# Patient Record
Sex: Male | Born: 1940 | Race: White | Hispanic: Yes | Smoking: Never smoker
Health system: Southern US, Community
[De-identification: ages and names within clinical notes are randomized; demographics above are authoritative.]

## PROBLEM LIST (undated history)

## (undated) DIAGNOSIS — I10 Essential (primary) hypertension: Secondary | ICD-10-CM

## (undated) DIAGNOSIS — I255 Ischemic cardiomyopathy: Secondary | ICD-10-CM

## (undated) DIAGNOSIS — Z950 Presence of cardiac pacemaker: Secondary | ICD-10-CM

## (undated) HISTORY — PX: CARDIAC CATHETERIZATION: SHX172

---

## 2020-02-02 ENCOUNTER — Emergency Department (HOSPITAL_COMMUNITY): Payer: HRSA Program

## 2020-02-02 ENCOUNTER — Other Ambulatory Visit: Payer: Self-pay

## 2020-02-02 ENCOUNTER — Inpatient Hospital Stay (HOSPITAL_COMMUNITY)
Admission: EM | Admit: 2020-02-02 | Discharge: 2020-02-26 | DRG: 207 | Disposition: E | Payer: HRSA Program | Attending: Emergency Medicine | Admitting: Emergency Medicine

## 2020-02-02 ENCOUNTER — Encounter (HOSPITAL_COMMUNITY): Payer: Self-pay | Admitting: *Deleted

## 2020-02-02 DIAGNOSIS — N170 Acute kidney failure with tubular necrosis: Secondary | ICD-10-CM | POA: Diagnosis present

## 2020-02-02 DIAGNOSIS — N1832 Chronic kidney disease, stage 3b: Secondary | ICD-10-CM | POA: Diagnosis present

## 2020-02-02 DIAGNOSIS — I129 Hypertensive chronic kidney disease with stage 1 through stage 4 chronic kidney disease, or unspecified chronic kidney disease: Secondary | ICD-10-CM | POA: Diagnosis present

## 2020-02-02 DIAGNOSIS — R569 Unspecified convulsions: Secondary | ICD-10-CM | POA: Diagnosis not present

## 2020-02-02 DIAGNOSIS — G928 Other toxic encephalopathy: Secondary | ICD-10-CM | POA: Diagnosis present

## 2020-02-02 DIAGNOSIS — J1282 Pneumonia due to coronavirus disease 2019: Secondary | ICD-10-CM

## 2020-02-02 DIAGNOSIS — A419 Sepsis, unspecified organism: Secondary | ICD-10-CM | POA: Diagnosis present

## 2020-02-02 DIAGNOSIS — I4891 Unspecified atrial fibrillation: Secondary | ICD-10-CM | POA: Diagnosis present

## 2020-02-02 DIAGNOSIS — I255 Ischemic cardiomyopathy: Secondary | ICD-10-CM | POA: Diagnosis present

## 2020-02-02 DIAGNOSIS — J9600 Acute respiratory failure, unspecified whether with hypoxia or hypercapnia: Secondary | ICD-10-CM

## 2020-02-02 DIAGNOSIS — I6200 Nontraumatic subdural hemorrhage, unspecified: Secondary | ICD-10-CM | POA: Diagnosis present

## 2020-02-02 DIAGNOSIS — E876 Hypokalemia: Secondary | ICD-10-CM | POA: Diagnosis present

## 2020-02-02 DIAGNOSIS — Z66 Do not resuscitate: Secondary | ICD-10-CM | POA: Diagnosis present

## 2020-02-02 DIAGNOSIS — R6521 Severe sepsis with septic shock: Secondary | ICD-10-CM | POA: Diagnosis present

## 2020-02-02 DIAGNOSIS — R739 Hyperglycemia, unspecified: Secondary | ICD-10-CM | POA: Diagnosis not present

## 2020-02-02 DIAGNOSIS — J151 Pneumonia due to Pseudomonas: Secondary | ICD-10-CM | POA: Diagnosis present

## 2020-02-02 DIAGNOSIS — E875 Hyperkalemia: Secondary | ICD-10-CM | POA: Diagnosis not present

## 2020-02-02 DIAGNOSIS — E871 Hypo-osmolality and hyponatremia: Secondary | ICD-10-CM | POA: Diagnosis present

## 2020-02-02 DIAGNOSIS — J9601 Acute respiratory failure with hypoxia: Secondary | ICD-10-CM

## 2020-02-02 DIAGNOSIS — H9190 Unspecified hearing loss, unspecified ear: Secondary | ICD-10-CM | POA: Diagnosis present

## 2020-02-02 DIAGNOSIS — J80 Acute respiratory distress syndrome: Secondary | ICD-10-CM

## 2020-02-02 DIAGNOSIS — I2510 Atherosclerotic heart disease of native coronary artery without angina pectoris: Secondary | ICD-10-CM | POA: Diagnosis present

## 2020-02-02 DIAGNOSIS — U071 COVID-19: Principal | ICD-10-CM | POA: Diagnosis present

## 2020-02-02 DIAGNOSIS — I214 Non-ST elevation (NSTEMI) myocardial infarction: Secondary | ICD-10-CM | POA: Diagnosis present

## 2020-02-02 DIAGNOSIS — Z950 Presence of cardiac pacemaker: Secondary | ICD-10-CM

## 2020-02-02 DIAGNOSIS — Z781 Physical restraint status: Secondary | ICD-10-CM

## 2020-02-02 DIAGNOSIS — A4189 Other specified sepsis: Secondary | ICD-10-CM | POA: Diagnosis present

## 2020-02-02 DIAGNOSIS — I21A1 Myocardial infarction type 2: Secondary | ICD-10-CM | POA: Diagnosis present

## 2020-02-02 DIAGNOSIS — R4182 Altered mental status, unspecified: Secondary | ICD-10-CM | POA: Diagnosis present

## 2020-02-02 DIAGNOSIS — Z7902 Long term (current) use of antithrombotics/antiplatelets: Secondary | ICD-10-CM

## 2020-02-02 DIAGNOSIS — Z515 Encounter for palliative care: Secondary | ICD-10-CM

## 2020-02-02 DIAGNOSIS — T380X5A Adverse effect of glucocorticoids and synthetic analogues, initial encounter: Secondary | ICD-10-CM | POA: Diagnosis not present

## 2020-02-02 DIAGNOSIS — A498 Other bacterial infections of unspecified site: Secondary | ICD-10-CM | POA: Diagnosis present

## 2020-02-02 HISTORY — DX: Ischemic cardiomyopathy: I25.5

## 2020-02-02 HISTORY — DX: Essential (primary) hypertension: I10

## 2020-02-02 HISTORY — DX: Presence of cardiac pacemaker: Z95.0

## 2020-02-02 LAB — I-STAT ARTERIAL BLOOD GAS, ED
Acid-base deficit: 2 mmol/L (ref 0.0–2.0)
Bicarbonate: 19.8 mmol/L — ABNORMAL LOW (ref 20.0–28.0)
Calcium, Ion: 1.04 mmol/L — ABNORMAL LOW (ref 1.15–1.40)
HCT: 38 % — ABNORMAL LOW (ref 39.0–52.0)
Hemoglobin: 12.9 g/dL — ABNORMAL LOW (ref 13.0–17.0)
O2 Saturation: 98 %
Patient temperature: 99.8
Potassium: 2.8 mmol/L — ABNORMAL LOW (ref 3.5–5.1)
Sodium: 127 mmol/L — ABNORMAL LOW (ref 135–145)
TCO2: 21 mmol/L — ABNORMAL LOW (ref 22–32)
pCO2 arterial: 27.7 mmHg — ABNORMAL LOW (ref 32.0–48.0)
pH, Arterial: 7.465 — ABNORMAL HIGH (ref 7.350–7.450)
pO2, Arterial: 104 mmHg (ref 83.0–108.0)

## 2020-02-02 LAB — CBC WITH DIFFERENTIAL/PLATELET
Abs Immature Granulocytes: 0.15 10*3/uL — ABNORMAL HIGH (ref 0.00–0.07)
Basophils Absolute: 0 10*3/uL (ref 0.0–0.1)
Basophils Relative: 0 %
Eosinophils Absolute: 0 10*3/uL (ref 0.0–0.5)
Eosinophils Relative: 0 %
HCT: 43 % (ref 39.0–52.0)
Hemoglobin: 14.1 g/dL (ref 13.0–17.0)
Immature Granulocytes: 1 %
Lymphocytes Relative: 6 %
Lymphs Abs: 1 10*3/uL (ref 0.7–4.0)
MCH: 29.5 pg (ref 26.0–34.0)
MCHC: 32.8 g/dL (ref 30.0–36.0)
MCV: 90 fL (ref 80.0–100.0)
Monocytes Absolute: 0.8 10*3/uL (ref 0.1–1.0)
Monocytes Relative: 4 %
Neutro Abs: 15.6 10*3/uL — ABNORMAL HIGH (ref 1.7–7.7)
Neutrophils Relative %: 89 %
Platelets: 263 10*3/uL (ref 150–400)
RBC: 4.78 MIL/uL (ref 4.22–5.81)
RDW: 14.1 % (ref 11.5–15.5)
WBC: 17.6 10*3/uL — ABNORMAL HIGH (ref 4.0–10.5)
nRBC: 0 % (ref 0.0–0.2)

## 2020-02-02 MED ORDER — ACETAMINOPHEN 650 MG RE SUPP
650.0000 mg | Freq: Once | RECTAL | Status: AC
  Administered 2020-02-03: 650 mg via RECTAL
  Filled 2020-02-02: qty 1

## 2020-02-02 MED ORDER — ACETAMINOPHEN 500 MG PO TABS: 1000.0000 mg | ORAL_TABLET | Freq: Once | ORAL | Status: DC

## 2020-02-02 NOTE — ED Notes (Signed)
medtronic interrogation completed

## 2020-02-02 NOTE — ED Triage Notes (Addendum)
Pt brought into to ER by Son for altered mental status and inability to walk since Saturday. Pt very hard of hearing (hearing aid noted to R ear) and was unable to use translator at triage. Initial sats reading 52% with good pleth in triage, finger tips noted to be dusky. Son at bedside. Today son was giving pt a bath, pt slipped and fell, possibly hitting his head. Pt is on plavix. 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 Son denies covid exposures and pt has been vaccinated

## 2020-02-02 NOTE — ED Provider Notes (Signed)
TIME SEEN: 11:26 PM  CHIEF COMPLAINT: Altered mental status  HPI: Patient is an 79 year old male who presents to the emergency department with his sons for concern for altered mental status since Saturday, December 4.  Patient does not speak [place] and is very hard of hearing and unable to answer many questions.  History obtained from patient's son using Spanish interpreter.  He states that he noticed the patient had difficult time walking and talking since Saturday.  He does report that yesterday he was giving the patient a bath and he slipped in the bathtub and did hit his head.  He is on Plavix.  Has history of CAD, pacemaker, hypertension.  It appears he is on furosemide.  Son is not sure if he has a history of CHF.  He states he has been told before that the patient has problems with his kidneys and liver.  He reports that patient used to drink alcohol heavily when he was younger but does not drink anymore.  No drug use.  Patient's son thought that he noticed yellowing of the patient's eyes.  Patient has been here from [place] for the past 4 months visiting family.  He has been vaccinated for COVID-19 x 2.   ROS: Level 5 caveat for altered mental status and being hard of hearing  PAST MEDICAL HISTORY/PAST SURGICAL HISTORY:  No past medical history on file.  MEDICATIONS:  Prior to Admission medications   Not on File    ALLERGIES:  Not on File  SOCIAL HISTORY:  Social History   Tobacco Use  . Smoking status: Not on file  Substance Use Topics  . Alcohol use: Not on file    FAMILY HISTORY: No family history on file.  EXAM: BP 109/75   Pulse 97   Temp (!) 102.3 F (39.1 C) (Rectal)   Resp (!) 26   SpO2 100%  CONSTITUTIONAL: Alert and oriented to person but does not answer questions appropriately, will follow some commands HEAD: Normocephalic, appears atraumatic EYES: Conjunctivae clear, pupils appear equal, EOM appear intact, no significant scleral icterus ENT: normal nose;  moist mucous membranes NECK: Supple, normal ROM CARD: Irregularly irregular and intermittently tachycardic; S1 and S2 appreciated; no murmurs, no clicks, no rubs, no gallops RESP: Patient is tachypneic.  Bibasilar knuckles on exam.  No rhonchi or wheezing.  Sats 44% on room air.  Dusky fingertips. ABD/GI: Normal bowel sounds; non-distended; soft, non-tender, no rebound, no guarding, no peritoneal signs, no hepatosplenomegaly BACK:  The back appears normal EXT: Normal ROM in all joints; no deformity noted, no edema; no calf tenderness or calf swelling SKIN: Cyanosis noted to patient's fingertips, warm; no rash on exposed skin NEURO: Moves all extremities equally, no pronator drift of bilateral upper extremities, unable to hold legs off of the bed, no obvious facial droop, no obvious dysarthria, unable to follow commands consistently, no asterixis PSYCH: The patient's mood and manner are appropriate.   MEDICAL DECISION MAKING: Patient here with altered mental status, hypoxia.  Differential includes CHF exacerbation, pneumonia, COVID-19, PE, hepatic encephalopathy, sepsis, UTI, intracranial hemorrhage, stroke.  Outside TPA window.  Last seen normal several days ago.  EKG shows atrial fibrillation with rate in the 110's.  It does not appear he has history of the same per records that son has brought to the emergency department.  Labs, urine, head CT, chest x-ray pending.  Patient will need admission.  Sats in the upper 80s, low 90s on nonrebreather.  Will start on BiPAP.  Will  interrogate pacemaker.  ED PROGRESS: Rectal temperature found to be 102.3.  Will begin septic work-up.  Chest x-ray concerning for bilateral pneumonia.  Will give antibiotics for community-acquired pneumonia, IV fluids.    Patient is Covid test is positive.  Blood gas shows PO2 of 104.  Lactate mildly elevated.  He has only had 1 brief episode of hypotension that immediately resolved after rechecking blood pressure.  Will hold on  fluids at this time given he has COVID-19.  We will also cancel antibiotics.  Will discuss with medicine for admission.  Improving on BiPAP.  CT head shows no acute abnormality.  Son at bedside updated.   Troponin elevated.  EKG shows no ischemia.  He denies any chest pain.  Likely demand related.  Will give dose of rectal aspirin.  Creatinine elevated here with no old for comparison.  1:00 AM  Discussed patient's case with hospitalist, Dr. Rachael Darby.  I have recommended admission and patient (and family if present) agree with this plan. Admitting physician will place admission orders.   I reviewed all nursing notes, vitals, pertinent previous records and reviewed/interpreted all EKGs, lab and urine results, imaging (as available).    EKG Interpretation  Date/Time:  Wednesday 16-Feb-2020 23:19:45 EST Ventricular Rate:  122 PR Interval:    QRS Duration: 106 QT Interval:  313 QTC Calculation: 443 R Axis:   50 Text Interpretation: Atrial fibrillation Inferior infarct, age indeterminate Lateral leads are also involved No old tracing to compare Confirmed by Amsi Grimley, Baxter Hire [phone]) on 02/03/2020 12:17:34 AM        CRITICAL CARE Performed by: Baxter Hire Fantashia Shupert   Total critical care time: 65 minutes  Critical care time was exclusive of separately billable procedures and treating other patients.  Critical care was necessary to treat or prevent imminent or life-threatening deterioration.  Critical care was time spent personally by me on the following activities: development of treatment plan with patient and/or surrogate as well as nursing, discussions with consultants, evaluation of patient's response to treatment, examination of patient, obtaining history from patient or surrogate, ordering and performing treatments and interventions, ordering and review of laboratory studies, ordering and review of radiographic studies, pulse oximetry and re-evaluation of patient's condition.   [place]  de [place] Sr. was evaluated in Emergency Department on 02-16-20 for the symptoms described in the history of present illness. He was evaluated in the context of the global COVID-19 pandemic, which necessitated consideration that the patient might be at risk for infection with the SARS-CoV-2 virus that causes COVID-19. Institutional protocols and algorithms that pertain to the evaluation of patients at risk for COVID-19 are in a state of rapid change based on information released by regulatory bodies including the CDC and federal and state organizations. These policies and algorithms were followed during the patient's care in the ED.      Brandelyn Henne, Layla Maw, DO 02/03/20 [phone]

## 2020-02-03 ENCOUNTER — Encounter (HOSPITAL_COMMUNITY): Payer: Self-pay | Admitting: Family Medicine

## 2020-02-03 ENCOUNTER — Inpatient Hospital Stay (HOSPITAL_COMMUNITY): Payer: HRSA Program

## 2020-02-03 ENCOUNTER — Other Ambulatory Visit: Payer: Self-pay

## 2020-02-03 ENCOUNTER — Other Ambulatory Visit (HOSPITAL_COMMUNITY): Payer: Self-pay

## 2020-02-03 DIAGNOSIS — J1282 Pneumonia due to coronavirus disease 2019: Secondary | ICD-10-CM

## 2020-02-03 DIAGNOSIS — I4891 Unspecified atrial fibrillation: Secondary | ICD-10-CM | POA: Diagnosis not present

## 2020-02-03 DIAGNOSIS — J9601 Acute respiratory failure with hypoxia: Secondary | ICD-10-CM

## 2020-02-03 DIAGNOSIS — U071 COVID-19: Secondary | ICD-10-CM | POA: Diagnosis not present

## 2020-02-03 DIAGNOSIS — R778 Other specified abnormalities of plasma proteins: Secondary | ICD-10-CM

## 2020-02-03 DIAGNOSIS — I214 Non-ST elevation (NSTEMI) myocardial infarction: Secondary | ICD-10-CM | POA: Diagnosis not present

## 2020-02-03 DIAGNOSIS — I248 Other forms of acute ischemic heart disease: Secondary | ICD-10-CM | POA: Diagnosis not present

## 2020-02-03 DIAGNOSIS — R4182 Altered mental status, unspecified: Secondary | ICD-10-CM | POA: Diagnosis present

## 2020-02-03 DIAGNOSIS — I361 Nonrheumatic tricuspid (valve) insufficiency: Secondary | ICD-10-CM | POA: Diagnosis not present

## 2020-02-03 DIAGNOSIS — J9600 Acute respiratory failure, unspecified whether with hypoxia or hypercapnia: Secondary | ICD-10-CM | POA: Diagnosis present

## 2020-02-03 DIAGNOSIS — J80 Acute respiratory distress syndrome: Secondary | ICD-10-CM | POA: Diagnosis present

## 2020-02-03 DIAGNOSIS — A419 Sepsis, unspecified organism: Secondary | ICD-10-CM | POA: Diagnosis not present

## 2020-02-03 LAB — FERRITIN: Ferritin: 587 ng/mL — ABNORMAL HIGH (ref 24–336)

## 2020-02-03 LAB — I-STAT ARTERIAL BLOOD GAS, ED
Acid-base deficit: 5 mmol/L — ABNORMAL HIGH (ref 0.0–2.0)
Acid-base deficit: 6 mmol/L — ABNORMAL HIGH (ref 0.0–2.0)
Bicarbonate: 23.3 mmol/L (ref 20.0–28.0)
Bicarbonate: 20.4 mmol/L (ref 20.0–28.0)
Calcium, Ion: 1.14 mmol/L — ABNORMAL LOW (ref 1.15–1.40)
Calcium, Ion: 1.1 mmol/L — ABNORMAL LOW (ref 1.15–1.40)
HCT: 40 % (ref 39.0–52.0)
HCT: 40 % (ref 39.0–52.0)
Hemoglobin: 13.6 g/dL (ref 13.0–17.0)
Hemoglobin: 13.6 g/dL (ref 13.0–17.0)
O2 Saturation: 90 %
O2 Saturation: 95 %
Patient temperature: 98.1
Patient temperature: 97.5
Potassium: 3.4 mmol/L — ABNORMAL LOW (ref 3.5–5.1)
Potassium: 3.9 mmol/L (ref 3.5–5.1)
Sodium: 133 mmol/L — ABNORMAL LOW (ref 135–145)
Sodium: 132 mmol/L — ABNORMAL LOW (ref 135–145)
TCO2: 25 mmol/L (ref 22–32)
TCO2: 22 mmol/L (ref 22–32)
pCO2 arterial: 57.7 mmHg — ABNORMAL HIGH (ref 32.0–48.0)
pCO2 arterial: 41.2 mmHg (ref 32.0–48.0)
pH, Arterial: 7.212 — ABNORMAL LOW (ref 7.350–7.450)
pH, Arterial: 7.298 — ABNORMAL LOW (ref 7.350–7.450)
pO2, Arterial: 70 mmHg — ABNORMAL LOW (ref 83.0–108.0)
pO2, Arterial: 84 mmHg (ref 83.0–108.0)

## 2020-02-03 LAB — URINALYSIS, ROUTINE W REFLEX MICROSCOPIC
Bilirubin Urine: NEGATIVE
Glucose, UA: NEGATIVE mg/dL
Ketones, ur: NEGATIVE mg/dL
Nitrite: NEGATIVE
Protein, ur: 30 mg/dL — AB
Specific Gravity, Urine: 1.018 (ref 1.005–1.030)
pH: 5 (ref 5.0–8.0)

## 2020-02-03 LAB — COMPREHENSIVE METABOLIC PANEL
ALT: 42 U/L (ref 0–44)
ALT: 40 U/L (ref 0–44)
AST: 139 U/L — ABNORMAL HIGH (ref 15–41)
AST: 117 U/L — ABNORMAL HIGH (ref 15–41)
Albumin: 2.9 g/dL — ABNORMAL LOW (ref 3.5–5.0)
Albumin: 2.4 g/dL — ABNORMAL LOW (ref 3.5–5.0)
Alkaline Phosphatase: 51 U/L (ref 38–126)
Alkaline Phosphatase: 47 U/L (ref 38–126)
Anion gap: 16 — ABNORMAL HIGH (ref 5–15)
Anion gap: 16 — ABNORMAL HIGH (ref 5–15)
BUN: 66 mg/dL — ABNORMAL HIGH (ref 8–23)
BUN: 77 mg/dL — ABNORMAL HIGH (ref 8–23)
CO2: 19 mmol/L — ABNORMAL LOW (ref 22–32)
CO2: 20 mmol/L — ABNORMAL LOW (ref 22–32)
Calcium: 8.2 mg/dL — ABNORMAL LOW (ref 8.9–10.3)
Calcium: 7.9 mg/dL — ABNORMAL LOW (ref 8.9–10.3)
Chloride: 93 mmol/L — ABNORMAL LOW (ref 98–111)
Chloride: 97 mmol/L — ABNORMAL LOW (ref 98–111)
Creatinine, Ser: 2.58 mg/dL — ABNORMAL HIGH (ref 0.61–1.24)
Creatinine, Ser: 2.52 mg/dL — ABNORMAL HIGH (ref 0.61–1.24)
GFR, Estimated: 24 mL/min — ABNORMAL LOW (ref >60)
GFR, Estimated: 25 mL/min — ABNORMAL LOW (ref >60)
Glucose, Bld: 129 mg/dL — ABNORMAL HIGH (ref 70–99)
Glucose, Bld: 128 mg/dL — ABNORMAL HIGH (ref 70–99)
Potassium: 3.3 mmol/L — ABNORMAL LOW (ref 3.5–5.1)
Potassium: 3.1 mmol/L — ABNORMAL LOW (ref 3.5–5.1)
Sodium: 128 mmol/L — ABNORMAL LOW (ref 135–145)
Sodium: 133 mmol/L — ABNORMAL LOW (ref 135–145)
Total Bilirubin: 1.7 mg/dL — ABNORMAL HIGH (ref 0.3–1.2)
Total Bilirubin: 1.1 mg/dL (ref 0.3–1.2)
Total Protein: 7.2 g/dL (ref 6.5–8.1)
Total Protein: 6.1 g/dL — ABNORMAL LOW (ref 6.5–8.1)

## 2020-02-03 LAB — ETHANOL: Alcohol, Ethyl (B): <10 mg/dL (ref <10)

## 2020-02-03 LAB — RESP PANEL BY RT-PCR (FLU A&B, COVID) ARPGX2
Influenza A by PCR: NEGATIVE
Influenza B by PCR: NEGATIVE
SARS Coronavirus 2 by RT PCR: POSITIVE — AB

## 2020-02-03 LAB — PROTIME-INR
INR: 1.1 (ref 0.8–1.2)
Prothrombin Time: 14.1 seconds (ref 11.4–15.2)

## 2020-02-03 LAB — CBC
HCT: 39.2 % (ref 39.0–52.0)
Hemoglobin: 12.8 g/dL — ABNORMAL LOW (ref 13.0–17.0)
MCH: 29.2 pg (ref 26.0–34.0)
MCHC: 32.7 g/dL (ref 30.0–36.0)
MCV: 89.3 fL (ref 80.0–100.0)
Platelets: 202 10*3/uL (ref 150–400)
RBC: 4.39 MIL/uL (ref 4.22–5.81)
RDW: 14 % (ref 11.5–15.5)
WBC: 13.1 10*3/uL — ABNORMAL HIGH (ref 4.0–10.5)
nRBC: 0 % (ref 0.0–0.2)

## 2020-02-03 LAB — CBG MONITORING, ED: Glucose-Capillary: 112 mg/dL — ABNORMAL HIGH (ref 70–99)

## 2020-02-03 LAB — FIBRINOGEN: Fibrinogen: >800 mg/dL — ABNORMAL HIGH (ref 210–475)

## 2020-02-03 LAB — TROPONIN I (HIGH SENSITIVITY)
Troponin I (High Sensitivity): 1439 ng/L (ref <18)
Troponin I (High Sensitivity): 4126 ng/L (ref <18)
Troponin I (High Sensitivity): 4188 ng/L (ref <18)
Troponin I (High Sensitivity): 3879 ng/L (ref <18)
Troponin I (High Sensitivity): 1978 ng/L (ref <18)

## 2020-02-03 LAB — LACTATE DEHYDROGENASE: LDH: 728 U/L — ABNORMAL HIGH (ref 98–192)

## 2020-02-03 LAB — RAPID URINE DRUG SCREEN, HOSP PERFORMED
Amphetamines: NOT DETECTED
Barbiturates: NOT DETECTED
Benzodiazepines: NOT DETECTED
Cocaine: NOT DETECTED
Opiates: NOT DETECTED
Tetrahydrocannabinol: NOT DETECTED

## 2020-02-03 LAB — BRAIN NATRIURETIC PEPTIDE: B Natriuretic Peptide: 594.8 pg/mL — ABNORMAL HIGH (ref 0.0–100.0)

## 2020-02-03 LAB — ECHOCARDIOGRAM LIMITED
Height: 62 in
Weight: 3200 oz

## 2020-02-03 LAB — D-DIMER, QUANTITATIVE
D-Dimer, Quant: 2.57 ug/mL-FEU — ABNORMAL HIGH (ref 0.00–0.50)
D-Dimer, Quant: 2.09 ug/mL-FEU — ABNORMAL HIGH (ref 0.00–0.50)

## 2020-02-03 LAB — LACTIC ACID, PLASMA
Lactic Acid, Venous: 2.6 mmol/L (ref 0.5–1.9)
Lactic Acid, Venous: 1.7 mmol/L (ref 0.5–1.9)

## 2020-02-03 LAB — PROCALCITONIN
Procalcitonin: 2.49 ng/mL
Procalcitonin: 2.75 ng/mL
Procalcitonin: 2.35 ng/mL

## 2020-02-03 LAB — APTT: aPTT: 34 seconds (ref 24–36)

## 2020-02-03 LAB — TRIGLYCERIDES: Triglycerides: 110 mg/dL (ref <150)

## 2020-02-03 LAB — GLUCOSE, CAPILLARY
Glucose-Capillary: 162 mg/dL — ABNORMAL HIGH (ref 70–99)
Glucose-Capillary: 156 mg/dL — ABNORMAL HIGH (ref 70–99)

## 2020-02-03 LAB — HEPARIN LEVEL (UNFRACTIONATED): Heparin Unfractionated: 0.31 IU/mL (ref 0.30–0.70)

## 2020-02-03 LAB — C-REACTIVE PROTEIN
CRP: 25.4 mg/dL — ABNORMAL HIGH (ref <1.0)
CRP: 22.3 mg/dL — ABNORMAL HIGH (ref <1.0)

## 2020-02-03 LAB — AMMONIA: Ammonia: 20 umol/L (ref 9–35)

## 2020-02-03 MED ORDER — MIDAZOLAM HCL 2 MG/2ML IJ SOLN: 2.0000 mg | Freq: Once | INTRAMUSCULAR | Status: AC

## 2020-02-03 MED ORDER — FENTANYL CITRATE (PF) 100 MCG/2ML IJ SOLN
100.0000 ug | Freq: Once | INTRAMUSCULAR | Status: AC
  Administered 2020-02-03: 100 ug via INTRAVENOUS

## 2020-02-03 MED ORDER — MIDAZOLAM HCL 2 MG/2ML IJ SOLN
INTRAMUSCULAR | Status: AC
  Administered 2020-02-03: 2 mg via INTRAVENOUS
  Filled 2020-02-03: qty 2

## 2020-02-03 MED ORDER — LACTATED RINGERS IV SOLN: INTRAVENOUS | Status: DC

## 2020-02-03 MED ORDER — FENTANYL BOLUS VIA INFUSION
50.0000 ug | INTRAVENOUS | Status: DC | PRN
  Administered 2020-02-03 – 2020-02-10 (×6): 50 ug via INTRAVENOUS
  Filled 2020-02-03: qty 50

## 2020-02-03 MED ORDER — HEPARIN BOLUS VIA INFUSION
4000.0000 [IU] | Freq: Once | INTRAVENOUS | Status: AC
  Administered 2020-02-03: 4000 [IU] via INTRAVENOUS
  Filled 2020-02-03: qty 4000

## 2020-02-03 MED ORDER — ETOMIDATE 2 MG/ML IV SOLN
20.0000 mg | Freq: Once | INTRAVENOUS | Status: AC
  Administered 2020-02-03: 20 mg via INTRAVENOUS

## 2020-02-03 MED ORDER — LACTATED RINGERS IV BOLUS: 1000.0000 mL | Freq: Once | INTRAVENOUS | Status: DC

## 2020-02-03 MED ORDER — DEXMEDETOMIDINE HCL IN NACL 400 MCG/100ML IV SOLN
0.4000 ug/kg/h | INTRAVENOUS | Status: DC
  Administered 2020-02-03: 0.8 ug/kg/h via INTRAVENOUS
  Administered 2020-02-03: 0.4 ug/kg/h via INTRAVENOUS
  Administered 2020-02-04: 0.6 ug/kg/h via INTRAVENOUS
  Filled 2020-02-03 (×4): qty 100

## 2020-02-03 MED ORDER — ROCURONIUM BROMIDE 50 MG/5ML IV SOLN
80.0000 mg | Freq: Once | INTRAVENOUS | Status: AC
  Administered 2020-02-03: 80 mg via INTRAVENOUS
  Filled 2020-02-03: qty 8

## 2020-02-03 MED ORDER — SODIUM CHLORIDE 0.9 % IV SOLN
200.0000 mg | Freq: Once | INTRAVENOUS | Status: AC
  Administered 2020-02-03: 200 mg via INTRAVENOUS
  Filled 2020-02-03: qty 40

## 2020-02-03 MED ORDER — BARICITINIB 1 MG PO TABS
2.0000 mg | ORAL_TABLET | Freq: Every day | ORAL | Status: DC
  Administered 2020-02-04 – 2020-02-06 (×3): 2 mg
  Filled 2020-02-03: qty 1
  Filled 2020-02-03 (×3): qty 2

## 2020-02-03 MED ORDER — PREDNISONE 20 MG PO TABS: 50.0000 mg | ORAL_TABLET | Freq: Every day | ORAL | Status: DC

## 2020-02-03 MED ORDER — SODIUM CHLORIDE 0.9 % IV SOLN
500.0000 mg | INTRAVENOUS | Status: DC
  Administered 2020-02-03 – 2020-02-07 (×5): 500 mg via INTRAVENOUS
  Filled 2020-02-03 (×5): qty 500

## 2020-02-03 MED ORDER — SODIUM CHLORIDE 0.9 % IV SOLN
500.0000 mg | INTRAVENOUS | Status: DC
  Filled 2020-02-03: qty 500

## 2020-02-03 MED ORDER — CHLORHEXIDINE GLUCONATE CLOTH 2 % EX PADS
6.0000 | MEDICATED_PAD | Freq: Every day | CUTANEOUS | Status: DC
  Administered 2020-02-04 – 2020-02-11 (×9): 6 via TOPICAL

## 2020-02-03 MED ORDER — ATORVASTATIN CALCIUM 10 MG PO TABS: 20.0000 mg | ORAL_TABLET | Freq: Every day | ORAL | Status: DC

## 2020-02-03 MED ORDER — DEXAMETHASONE SODIUM PHOSPHATE 10 MG/ML IJ SOLN
6.0000 mg | Freq: Once | INTRAMUSCULAR | Status: AC
  Administered 2020-02-03: 6 mg via INTRAVENOUS
  Filled 2020-02-03: qty 1

## 2020-02-03 MED ORDER — METHYLPREDNISOLONE SODIUM SUCC 125 MG IJ SOLR
1.0000 mg/kg | Freq: Two times a day (BID) | INTRAMUSCULAR | Status: DC
  Administered 2020-02-03 – 2020-02-04 (×3): 90.625 mg via INTRAVENOUS
  Filled 2020-02-03 (×3): qty 2

## 2020-02-03 MED ORDER — ONDANSETRON HCL 4 MG/2ML IJ SOLN: 4.0000 mg | Freq: Four times a day (QID) | INTRAMUSCULAR | Status: DC | PRN

## 2020-02-03 MED ORDER — SODIUM CHLORIDE 0.9 % IV SOLN
2.0000 g | INTRAVENOUS | Status: DC
  Administered 2020-02-03: 2 g via INTRAVENOUS
  Filled 2020-02-03: qty 20

## 2020-02-03 MED ORDER — SODIUM CHLORIDE 0.9 % IV BOLUS (SEPSIS)
500.0000 mL | Freq: Once | INTRAVENOUS | Status: AC
  Administered 2020-02-03: 500 mL via INTRAVENOUS

## 2020-02-03 MED ORDER — LORAZEPAM 2 MG/ML IJ SOLN
0.5000 mg | Freq: Once | INTRAMUSCULAR | Status: AC
  Administered 2020-02-03: 0.5 mg via INTRAVENOUS
  Filled 2020-02-03: qty 1

## 2020-02-03 MED ORDER — SODIUM CHLORIDE 0.9 % IV SOLN: 100.0000 mg | Freq: Every day | INTRAVENOUS | Status: DC

## 2020-02-03 MED ORDER — FENTANYL CITRATE (PF) 100 MCG/2ML IJ SOLN: 50.0000 ug | Freq: Once | INTRAMUSCULAR | Status: DC

## 2020-02-03 MED ORDER — LACTATED RINGERS IV BOLUS
250.0000 mL | Freq: Once | INTRAVENOUS | Status: AC
  Administered 2020-02-03: 250 mL via INTRAVENOUS

## 2020-02-03 MED ORDER — NOREPINEPHRINE 4 MG/250ML-% IV SOLN
0.0000 ug/min | INTRAVENOUS | Status: DC
  Administered 2020-02-06: 18:00:00 2 ug/min via INTRAVENOUS
  Administered 2020-02-06: 09:00:00 4 ug/min via INTRAVENOUS
  Administered 2020-02-07: 16:00:00 8 ug/min via INTRAVENOUS
  Administered 2020-02-07: 08:00:00 5 ug/min via INTRAVENOUS
  Administered 2020-02-08: 07:00:00 3 ug/min via INTRAVENOUS
  Administered 2020-02-09: 12:00:00 10 ug/min via INTRAVENOUS
  Administered 2020-02-09: 12:00:00 11 ug/min via INTRAVENOUS
  Administered 2020-02-09: 18:00:00 5 ug/min via INTRAVENOUS
  Administered 2020-02-10: 18:00:00 2 ug/min via INTRAVENOUS
  Administered 2020-02-11: 15:00:00 8 ug/min via INTRAVENOUS
  Filled 2020-02-03: qty 500
  Filled 2020-02-03 (×11): qty 250

## 2020-02-03 MED ORDER — GUAIFENESIN-DM 100-10 MG/5ML PO SYRP: 10.0000 mL | ORAL_SOLUTION | ORAL | Status: DC | PRN

## 2020-02-03 MED ORDER — ONDANSETRON HCL 4 MG PO TABS: 4.0000 mg | ORAL_TABLET | Freq: Four times a day (QID) | ORAL | Status: DC | PRN

## 2020-02-03 MED ORDER — SODIUM CHLORIDE 0.9 % IV SOLN
1.0000 g | INTRAVENOUS | Status: DC
  Administered 2020-02-03 – 2020-02-07 (×5): 1 g via INTRAVENOUS
  Filled 2020-02-03 (×5): qty 10

## 2020-02-03 MED ORDER — HYDROCOD POLST-CPM POLST ER 10-8 MG/5ML PO SUER
5.0000 mL | Freq: Two times a day (BID) | ORAL | Status: DC | PRN
  Administered 2020-02-03: 5 mL via ORAL
  Filled 2020-02-03: qty 5

## 2020-02-03 MED ORDER — ASPIRIN EC 81 MG PO TBEC: 81.0000 mg | DELAYED_RELEASE_TABLET | Freq: Every day | ORAL | Status: DC

## 2020-02-03 MED ORDER — POTASSIUM CHLORIDE 10 MEQ/100ML IV SOLN
10.0000 meq | INTRAVENOUS | Status: AC
  Administered 2020-02-03 (×2): 10 meq via INTRAVENOUS
  Filled 2020-02-03 (×2): qty 100

## 2020-02-03 MED ORDER — HEPARIN (PORCINE) 25000 UT/250ML-% IV SOLN
1000.0000 [IU]/h | INTRAVENOUS | Status: DC
  Administered 2020-02-03 – 2020-02-04 (×3): 1000 [IU]/h via INTRAVENOUS
  Filled 2020-02-03 (×3): qty 250

## 2020-02-03 MED ORDER — FENTANYL 2500MCG IN NS 250ML (10MCG/ML) PREMIX INFUSION
50.0000 ug/h | INTRAVENOUS | Status: DC
  Administered 2020-02-03: 50 ug/h via INTRAVENOUS
  Administered 2020-02-04: 125 ug/h via INTRAVENOUS
  Administered 2020-02-05: 18:00:00 150 ug/h via INTRAVENOUS
  Administered 2020-02-05: 01:00:00 100 ug/h via INTRAVENOUS
  Administered 2020-02-06 – 2020-02-07 (×2): 150 ug/h via INTRAVENOUS
  Administered 2020-02-07 – 2020-02-12 (×10): 200 ug/h via INTRAVENOUS
  Filled 2020-02-03 (×16): qty 250

## 2020-02-03 MED ORDER — SODIUM CHLORIDE 0.9 % IV SOLN: 200.0000 mg | Freq: Once | INTRAVENOUS | Status: DC

## 2020-02-03 MED ORDER — ASPIRIN 300 MG RE SUPP
300.0000 mg | Freq: Once | RECTAL | Status: AC
  Administered 2020-02-03: 300 mg via RECTAL
  Filled 2020-02-03: qty 1

## 2020-02-03 MED ORDER — NOREPINEPHRINE 4 MG/250ML-% IV SOLN
INTRAVENOUS | Status: AC
  Administered 2020-02-03: 5 ug/min via INTRAVENOUS
  Filled 2020-02-03: qty 250

## 2020-02-03 MED ORDER — LACTATED RINGERS IV BOLUS (SEPSIS)
1000.0000 mL | Freq: Once | INTRAVENOUS | Status: DC
  Administered 2020-02-03: 1000 mL via INTRAVENOUS

## 2020-02-03 NOTE — Progress Notes (Signed)
Spoke with son who spoke with sister from [place] who is primary caregiver.  Decision made to give trial on ventilator, pressors but in the event of cardiovascular collapse allow to die in peace.  If he languishes on vent, will likely switch GOC to comfort.  Myrla Halsted MD PCCM

## 2020-02-03 NOTE — Sepsis Progress Note (Signed)
following for sepsis monitoring 

## 2020-02-03 NOTE — ED Notes (Signed)
Grandson now at bedside in PPE to see grandfather d/t critical condition and declining cardiac function.

## 2020-02-03 NOTE — Consult Note (Addendum)
02/03/2020 I saw and evaluated the patient. Discussed with resident and agree with resident's findings and plan as documented in the resident's note.  I have seen and evaluated the patient for respiratory failure due to COVID.  S:  79 year old hispanic man with hx of ischemic cardiomyopathy presenting with worsening AMS, dyspnea, falls found to have COVID ARDS, NSTEMI, pulmonary edema.  Respiratory status deteriorating so PCCM consulted for further management.  Patient is visiting his son from [place].  Patient is fully vaccinated.  O: Blood pressure 114/75, pulse 77, temperature (!) 100.7 F (38.2 C), temperature source Rectal, resp. rate (!) 28, height 5\' 2"  (1.575 m), weight 90.7 kg, SpO2 100 %.  Ill appearing man tachypneic on BIPAP Lungs with crackles bases, +accessory muscle use Moves all 4 ext but not to command Heart sounds irregular, ext warm  Reduced renal function, elevated inflammatory markers, trop leak Severe ARDS on CXR CT head looks okay  A:  - Acute hypoxemic respiratory failure secondary to COVID ARDS in fully vaccinated - Underlying ischemic cardiomyopathy with troponin leak, type 1 vs. 2 NSTEMI - Afib with intermittent RVR - Shock distributive vs. Cardiogenic - Elevated Pct question superimposed CAP - Presumed AKI although baseline renal function unknown  P:  -Discussed at length with son (see separate note) -Intubate, ventilate with lung-protective tidal volumes -Prone for P/F <150 -Check SvO2, echo -Steroids, baricitinib -Ceftriaxone/azithromycin -Heparin gtt, ASA, statin -Guarded prognosis, DNR in event of cardiac arrest  The patient is critically ill with multiple organ systems failure and requires high complexity decision making for assessment and support, frequent evaluation and titration of therapies, application of advanced monitoring technologies and extensive interpretation of multiple databases. Critical Care Time devoted to patient care services  described in this note independent of APP/resident  time is 41 minutes.   02/03/2020 14/10/2019 MD      NAME:  Lucas Axel Sr., MRN:  Audria Nine, DOB:  [birth year], LOS: 0 ADMISSION DATE:  01/29/2020, CONSULTATION DATE:  02/03/20 REFERRING MD:  14/9/21  CHIEF COMPLAINT:  Dyspnea, AMS  Brief History   Lucas de La Jerral Ralph Sr. is a 79 y.o. male who resides in [place] but is in [place] visiting family.  He is vaccinated for COVID per reports but was unfortunately admitted overnight 12/8 with COVID PNA.  Initially placed on BiPAP but PCCM consulted AM 12/9 for intubation.  History of present illness   Pt is not able to communicate due to BiPAP, language barrier, hard or hearing state; therefore, this HPI is obtained from chart review. Lucas de La 14/9 Sr. is a 79 y.o. male who has a PMH including but not limited to CAD, HTN, cardiomyopathy, hard of hearing.  He resides in [place] but is in [place] visiting family.  He has been in [place] for 4 weeks.  He has been vaccinated for COVID x 2 per reports.  He presented to [facility] ED 12/8 with AMS, confusion, dyspnea.  He was apparently seen in ED 12/4 with AMS and was discharged home.  12/8, he fell in bathtub and then son noted him to have worsening confusion, weakness, SOB.  In ED, he was found to by hypoxic to the 50's with tachypnea and confusion.  COVID PCR was positive.  He was started on remdesivir and steroids and was placed on BiPAP.  Initial EKG also showed A.fib with RVR where rates improved after BiPAP.  Initial troponin 4126.  Cards consulted for NSTEMI.  CT head negative.  Past Medical History  has Pneumonia due to COVID-19 virus; Acute respiratory failure with hypoxia (HCC); Sepsis (HCC); Altered mental status; Atrial fibrillation with RVR (HCC); and NSTEMI (non-ST elevated myocardial infarction) (HCC) on their problem list.  Significant Hospital Events   12/9 > admitted overnight.  Intubated AM 12/9.  Consults:   PCCM.  Procedures:  ETT 12/9 >   Significant Diagnostic Tests:  CT head 12/ 9 > bilateral ethmoid sinus disease. Echo 12/9 >   Micro Data:  COVID 12/8 > POS. Flu 12/8 > neg. Blood 12/8 >   Antimicrobials:  Azithro 12/8 >  Ceftriaxone 12/8 >  Remdesivir 12/8 >    Interim history/subjective:  Marked distress  Objective:  Blood pressure (!) 84/53, pulse 78, temperature (!) 100.7 F (38.2 C), temperature source Rectal, resp. rate (!) 28, height 5\' 2"  (1.575 m), weight 90.7 kg, SpO2 96 %.    Vent Mode: BIPAP;PCV FiO2 (%):  [100 %] 100 % Set Rate:  [12 bmp] 12 bmp PEEP:  [5 cmH20] 5 cmH20  No intake or output data in the 24 hours ending 02/03/20 0928 Filed Weights   02/03/20 0503  Weight: 90.7 kg    Examination: General: acutely ill 79 year old male  Neuro: will hold hand, won't follow commands. spont moves all 4 ext HEENT: BIPAP in place Cardiovascular: RRR Lungs: decreased t/o RR 30s marked accessory use Abdomen: soft  Musculoskeletal: intact Skin: cool. Mottled   Assessment & Plan:  Acute hypoxic respiratory failure w/ COVID PNA/ARDS +/- pulmonary edema -has failed NIPPV pcxr w/ diffuse airspace disease  Plan Intubate ARDS protocol w/ low VT ventilation Goal plateau pressure less than 30 Goal driving pressures 94 sats >85 pao2 goal > 65 Prone protocol if P/F ratio <150 Day 1 of 5 Remdesivir  Day 1 Solumedrol; taper after 5 days Day 1 rocephin and azith  baricitinib VT day 1 of 14   SIRS/Sepsis in setting of above -s/p 2 liters of crystalloid Plan Transduce CVP Cont IVFs abx as above Trend cbc and lactate  NSTEMI in setting of CAD w/ presumed h/o heart failure; looks like demand ischemia  Has h/o pace maker -trop I climbing Plan IV heparin Tele  Asa VT Cards consult   AF w/ RVR Plan Cont tele  Holding bb  Fluid and electrolyte imbalance: hyponatremia, hypokalemia, anion gap metabolic acidosis Plan Recheck lytes Serial  chemistries Replace as needed   Best Practice:  Diet: NPO Pain/Anxiety/Delirium protocol (if indicated): 12/9 VAP protocol (if indicated): 12/9 DVT prophylaxis: SCD's / Heparin. GI prophylaxis: PPI Glucose control: ssi Mobility: BR Last date of multidisciplinary goals of care discussion: goals of care discussed w/ son. Dr 14/9  Family and staff present: no family present  Summary of discussion: intubate/support but DNR if arrests  Follow up goals of care discussion due:12/16 Code Status: DNR in setting of cardiac arrest Family Communication: son Lucas Clay Disposition: to ICU  Labs   CBC: Recent Labs  Lab 03-Feb-2020 2325 02-03-20 2347 02/03/20 0805  WBC 17.6*  --  13.1*  NEUTROABS 15.6*  --   --   HGB 14.1 12.9* 12.8*  HCT 43.0 38.0* 39.2  MCV 90.0  --  89.3  PLT 263  --  202   Basic Metabolic Panel: Recent Labs  Lab 2020/02/03 2325 02/03/2020 2347  NA 128* 127*  K 3.3* 2.8*  CL 93*  --   CO2 19*  --   GLUCOSE 129*  --   BUN 66*  --  CREATININE 2.58*  --   CALCIUM 8.2*  --    GFR: Estimated Creatinine Clearance: 21.9 mL/min (A) (by C-G formula based on SCr of 2.58 mg/dL (H)). Recent Labs  Lab 02/06/2020 2324 02/24/2020 2325 02/03/20 0402 02/03/20 0507 02/03/20 0805  PROCALCITON  --  2.49  --  2.75  --   WBC  --  17.6*  --   --  13.1*  LATICACIDVEN 2.6*  --  1.7  --   --    Liver Function Tests: Recent Labs  Lab 01/29/2020 2325  AST 139*  ALT 42  ALKPHOS 51  BILITOT 1.7*  PROT 7.2  ALBUMIN 2.9*   No results for input(s): LIPASE, AMYLASE in the last 168 hours. Recent Labs  Lab 02/05/2020 2325  AMMONIA 20   ABG    Component Value Date/Time   PHART 7.465 (H) 02/04/2020 2347   PCO2ART 27.7 (L) 02/13/2020 2347   PO2ART 104 02/14/2020 2347   HCO3 19.8 (L) 02/17/2020 2347   TCO2 21 (L) 01/27/2020 2347   ACIDBASEDEF 2.0 02/24/2020 2347   O2SAT 98.0 02/08/2020 2347    Coagulation Profile: Recent Labs  Lab 01/31/2020 2325  INR 1.1   Cardiac  Enzymes: No results for input(s): CKTOTAL, CKMB, CKMBINDEX, TROPONINI in the last 168 hours. HbA1C: No results found for: HGBA1C CBG: Recent Labs  Lab 02/03/20 0016  GLUCAP 112*    Review of Systems:   Not able   Past medical history  He,  has a past medical history of Hypertension, Ischemic cardiomyopathy, and Pacemaker.   Surgical History    Past Surgical History:  Procedure Laterality Date  . CARDIAC CATHETERIZATION       Social History   reports that he has never smoked. He has never used smokeless tobacco. He reports that he does not drink alcohol and does not use drugs.   Family history   His family history is not on file.   Allergies Not on File   Home meds  Prior to Admission medications   Not on File    Critical care time: 34 min   Simonne Martinet ACNP-BC [facility] Pulmonary/Critical Care Pager # [phone] OR # [phone] if no answer

## 2020-02-03 NOTE — Progress Notes (Signed)
ANTICOAGULATION CONSULT NOTE - Follow Up Consult  Pharmacy Consult for heparin Indication: chest pain/ACS  Not on File  Patient Measurements: Height: 5\' 2"  (157.5 cm) Weight: 90.7 kg (200 lb) IBW/kg (Calculated) : 54.6 Heparin Dosing Weight: 90.7 kg  Vital Signs: Temp: 97.5 F (36.4 C) (12/09 1245) Temp Source: Axillary (12/09 1245) BP: 101/65 (12/09 1330) Pulse Rate: 64 (12/09 1330)  Labs: Recent Labs    02-27-2020 2325 2020/02/27 2335 Feb 27, 2020 2347 02/03/20 0402 02/03/20 0507 02/03/20 0805 02/03/20 1220 02/03/20 1334  HGB 14.1  --  12.9*  --   --  12.8* 13.6  --   HCT 43.0  --  38.0*  --   --  39.2 40.0  --   PLT 263  --   --   --   --  202  --   --   APTT  --  34  --   --   --   --   --   --   LABPROT 14.1  --   --   --   --   --   --   --   INR 1.1  --   --   --   --   --   --   --   HEPARINUNFRC  --   --   --   --   --   --   --  0.31  CREATININE 2.58*  --   --   --   --  2.52*  --   --   TROPONINIHS 1,439*  --   --  4,126* 4,188* 3,879*  --   --     Estimated Creatinine Clearance: 22.4 mL/min (A) (by C-G formula based on SCr of 2.52 mg/dL (H)).   Medical History: Past Medical History:  Diagnosis Date  . Hypertension   . Ischemic cardiomyopathy   . Pacemaker    medtronic    Medications:  unknown Assessment: 79 yo man on IV heparin for ACS. Initial heparin level is therapeutic at 0.31. H/H and Plt wnl today.   Goal of Therapy:  Heparin level 0.3-0.7 units/ml Monitor platelets by anticoagulation protocol: Yes   Plan:  Continue IV heparin at 1000 units/hr  Daily HL and CBC while on heparin Monitor for bleeding complications  94, PharmD., BCPS, BCCCP Clinical Pharmacist Please refer to [facility] for unit-specific pharmacist

## 2020-02-03 NOTE — Progress Notes (Signed)
eLink Physician-Brief Progress Note Patient Name: Lucas Bullinger Sr. DOB: 09-13-1940 MRN: [id number]   Date of Service  02/03/2020  HPI/Events of Note  Patient admitted with severe Covid 19 pneumonia with ARDS, acute hypoxemic respiratory failure,  NSTEMI, AKI , and concern for superinfection with bacterial pneumonia.  eICU Interventions  New Patient Evaluation completed.        Lucas Clay 02/03/2020, 9:16 PM

## 2020-02-03 NOTE — Progress Notes (Signed)
Pt. Transported from Rm 35 to 24 in ED without any complications.

## 2020-02-03 NOTE — ED Notes (Signed)
225 output

## 2020-02-03 NOTE — ED Notes (Signed)
Spoke with [facility] and d/t patient's fragile state and declining condition, son to be called to try and get him to bedside.

## 2020-02-03 NOTE — Consult Note (Signed)
Cardiology Consultation:   Patient ID: Lucas Abbe de La Chapman Moss Sr. MRN: [id number]; DOB: [birth year]  Admit date: 02/22/2020 Date of Consult: 02/03/2020  Primary Care Provider: Patient, No Pcp Per [facility] Cardiologist:  New to [facility]  [facility] Electrophysiologist:  None    Patient Profile:   Lucas de La Chapman Moss Sr. is a 79 y.o. male with a hx of  Ischemic CM, CAD , HTN, pacer  who is being seen today for the evaluation of  Troponin elevation  at the request of  Dr. Katrinka Blazing. .  History of Present Illness:   Lucas Clay is a 79 yo from [place].  He was visiting with his family here in [place].  The patient was seen in our emergency room on December 4 with altered mental status.  At that time he had confusion and difficulty walking but was sent home from the emergency room.  His son reported that he fell in the bathtub yesterday and hit his head.  He has had increased lethargy, confusion, shortness of breath over the previous day.  He reportedly has been vaccinated for COVID-19 with 2 doses of the vaccine.  He has been in [place] for the past 4 months visiting family.  On admission he was found to be hypoxic with O2 saturation in the mid 50s on room air.  He was tachypneic and confused. His chest x-ray is consistent with ARDS.  He was started on steroids and remdesivir.  His troponin level was found to be 4126.  Of note is that his creatinine is 2.5.  He is intubated and unable to give any history .  History was obtained from his son by previous PCCM providers.    Past Medical History:  Diagnosis Date  . Hypertension   . Ischemic cardiomyopathy   . Pacemaker    medtronic    Past Surgical History:  Procedure Laterality Date  . CARDIAC CATHETERIZATION       Home Medications:  Prior to Admission medications   Not on File    Inpatient Medications: Scheduled Meds: . aspirin EC  81 mg Oral Daily  . atorvastatin  20 mg Oral  Daily  . baricitinib  2 mg Per Tube Daily  . fentaNYL (SUBLIMAZE) injection  50 mcg Intravenous Once  . methylPREDNISolone (SOLU-MEDROL) injection  1 mg/kg Intravenous Q12H   Followed by  . [START ON 02/06/2020] predniSONE  50 mg Oral Daily   Continuous Infusions: . azithromycin Stopped (02/03/20 0915)  . cefTRIAXone (ROCEPHIN)  IV Stopped (02/03/20 0804)  . dexmedetomidine (PRECEDEX) IV infusion 0.4 mcg/kg/hr (02/03/20 1254)  . fentaNYL infusion INTRAVENOUS 50 mcg/hr (02/03/20 1018)  . heparin 1,000 Units/hr (02/03/20 1407)  . lactated ringers 75 mL/hr at 02/03/20 1406  . norepinephrine (LEVOPHED) Adult infusion 3 mcg/min (02/03/20 1313)   PRN Meds: chlorpheniramine-HYDROcodone, fentaNYL, guaiFENesin-dextromethorphan, ondansetron **OR** ondansetron (ZOFRAN) IV  Allergies:   Not on File  Social History:   Social History   Socioeconomic History  . Marital status: Unknown    Spouse name: Not on file  . Number of children: Not on file  . Years of education: Not on file  . Highest education level: Not on file  Occupational History  . Not on file  Tobacco Use  . Smoking status: Never Smoker  . Smokeless tobacco: Never Used  Substance and Sexual Activity  . Alcohol use: Never  . Drug use: Never  . Sexual activity: Not on file  Other Topics Concern  .  Not on file  Social History Narrative  . Not on file   Social Determinants of Health   Financial Resource Strain: Not on file  Food Insecurity: Not on file  Transportation Needs: Not on file  Physical Activity: Not on file  Stress: Not on file  Social Connections: Not on file  Intimate Partner Violence: Not on file    Family History:   History reviewed. No pertinent family history.  Unable to get any hx .  Pt is intubated. sedated ROS:  Please see the history of present illness.   All other ROS reviewed and negative.     Physical Exam/Data:   Vitals:   02/03/20 1445 02/03/20 1500 02/03/20 1510 02/03/20 1515   BP: 135/85 (!) 142/82 140/86 (!) 156/79  Pulse: 66 (!) 59 62   Resp: (!) 34 (!) 30 (!) 34   Temp:      TempSrc:      SpO2: 98% 99% 100%   Weight:      Height:        Intake/Output Summary (Last 24 hours) at 02/03/2020 1533 Last data filed at 02/03/2020 1455 Gross per 24 hour  Intake 600 ml  Output 275 ml  Net 325 ml   Last 3 Weights 02/03/2020  Weight (lbs) 200 lb  Weight (kg) 90.719 kg     Body mass index is 36.58 kg/m.  General:  Extremely ill , elderly gentleman,  In the vent , sedated  HEENT: normal Lymph: no adenopathy Neck: no JVD Endocrine:  No thryomegaly Vascular: No carotid bruits; FA pulses 2+ bilaterally without bruits  Cardiac:  Irreg. Irreg.  Lungs:  On the vent  Abd: soft, nontender, no hepatomegaly  Ext: no edema Musculoskeletal:  No deformities, BUE and BLE strength normal and equal Skin: warm and dry  Neuro:  CNs 2-12 intact, no focal abnormalities noted Psych:  Normal affect   EKG:  The EKG was personally reviewed and demonstrates:   Atrial fib with V rate of 122 No ST or T wave changes   Telemetry:  Telemetry was personally reviewed and demonstrates:   Atrial fib   Relevant CV Studies:   Laboratory Data:  High Sensitivity Troponin:   Recent Labs  Lab 02/13/2020 2325 02/03/20 0402 02/03/20 0507 02/03/20 0805 02/03/20 1333  TROPONINIHS 1,439* 4,126* 4,188* 3,879* 1,978*     Chemistry Recent Labs  Lab 02/09/2020 2325 02/24/2020 2347 02/03/20 0805 02/03/20 1220 02/03/20 1454  NA 128*   < > 133* 133* 132*  K 3.3*   < > 3.1* 3.4* 3.9  CL 93*  --  97*  --   --   CO2 19*  --  20*  --   --   GLUCOSE 129*  --  128*  --   --   BUN 66*  --  77*  --   --   CREATININE 2.58*  --  2.52*  --   --   CALCIUM 8.2*  --  7.9*  --   --   GFRNONAA 24*  --  25*  --   --   ANIONGAP 16*  --  16*  --   --    < > = values in this interval not displayed.    Recent Labs  Lab 02/10/2020 2325 02/03/20 0805  PROT 7.2 6.1*  ALBUMIN 2.9* 2.4*  AST 139* 117*   ALT 42 40  ALKPHOS 51 47  BILITOT 1.7* 1.1   Hematology Recent Labs  Lab 02/22/2020 2325 01/31/2020  2347 02/03/20 0805 02/03/20 1220 02/03/20 1454  WBC 17.6*  --  13.1*  --   --   RBC 4.78  --  4.39  --   --   HGB 14.1   < > 12.8* 13.6 13.6  HCT 43.0   < > 39.2 40.0 40.0  MCV 90.0  --  89.3  --   --   MCH 29.5  --  29.2  --   --   MCHC 32.8  --  32.7  --   --   RDW 14.1  --  14.0  --   --   PLT 263  --  202  --   --    < > = values in this interval not displayed.   BNP Recent Labs  Lab 01/28/2020 2325  BNP 594.8*    DDimer  Recent Labs  Lab 02/01/2020 2325 02/03/20 0805  DDIMER 2.57* 2.09*     Radiology/Studies:  CT Head Wo Contrast  Result Date: 02/03/2020 CLINICAL DATA:  Status post fall. EXAM: CT HEAD WITHOUT CONTRAST TECHNIQUE: Contiguous axial images were obtained from the base of the skull through the vertex without intravenous contrast. COMPARISON:  None. FINDINGS: Brain: There is mild to moderate severity cerebral atrophy with widening of the extra-axial spaces and ventricular dilatation. There are areas of decreased attenuation within the white matter tracts of the supratentorial brain, consistent with microvascular disease changes. Vascular: No hyperdense vessel or unexpected calcification. Skull: Normal. Negative for fracture or focal lesion. Sinuses/Orbits: There is mild to moderate severity bilateral ethmoid sinus mucosal thickening. Other: None. IMPRESSION: 1. Generalized atrophy and microvascular disease changes of the supratentorial brain. 2. No acute intracranial abnormality. 3. Mild to moderate severity bilateral ethmoid sinus disease. Electronically Signed   By: Aram Candela M.D.   On: 02/03/2020 00:23   DG Chest Portable 1 View  Result Date: 02/03/2020 CLINICAL DATA:  Post intubation right IJ placement. EXAM: PORTABLE CHEST 1 VIEW COMPARISON:  February 02, 2020. FINDINGS: Interval intubation with ET tube approximately 4.8 cm above the carina. Improved  aeration with redemonstrated bilateral extensive airspace opacities. No visible pleural effusions or pneumothorax. Right IJ central venous catheter with the tip projecting near the superior cavoatrial junction. Gastric tube courses below the diaphragm. Similar mildly enlarged cardiac silhouette. CABG with similar fractured superior two median sternotomy wires. Left approach subclavian approach cardiac rhythm maintenance device. IMPRESSION: 1. Interval intubation with ET tube approximately 4.8 cm above the carina. Improved aeration with redemonstrated bilateral extensive airspace opacities concerning for pneumonia 2. Right IJ central venous catheter with the tip projecting near the superior cavoatrial junction. No visible Newman. Electronically Signed   By: Feliberto Harts MD   On: 02/03/2020 11:41   DG Chest Portable 1 View  Result Date: 01/30/2020 CLINICAL DATA:  Hypoxia EXAM: PORTABLE CHEST 1 VIEW COMPARISON:  None. FINDINGS: Post sternotomy changes. Left-sided pacing device with leads projecting over right atrium and slightly unusual course of additional lead possibly over coronary sinus region. Mild cardiomegaly with aortic atherosclerosis. Extensive bilateral airspace disease with consolidations and ground-glass opacities. No pleural effusion or pneumothorax. IMPRESSION: 1. Extensive bilateral airspace disease with consolidations and ground-glass opacities, favored to represent bilateral pneumonia. 2. Mild cardiomegaly. Electronically Signed   By: Jasmine Pang M.D.   On: 02/23/2020 23:43   ECHOCARDIOGRAM LIMITED  Result Date: 02/03/2020    ECHOCARDIOGRAM LIMITED REPORT   Patient Name:   Lucas de La Chapman Moss Sr. Date of Exam: 02/03/2020 Medical Rec #:  [id number]                           Height:       62.0 in Accession #:    [id number]                          Weight:       200.0 lb Date of Birth:  08/17/1940                            BSA:          1.912 m Patient Age:    79 years                             BP:           108/68 mmHg Patient Gender: M                                   HR:           72 bpm. Exam Location:  Inpatient Procedure: 2D Echo STAT ECHO Indications:    NSTEMI I21.4  History:        Patient has no prior history of Echocardiogram examinations.                 Ischemic Cardiomyopathy, Pacemaker; Risk Factors:Hypertension.  Sonographer:    Thurman Coyerasey Kirkpatrick RDCS (AE) Referring Phys: [id number] Lorin GlassANIEL C SMITH  Sonographer Comments: Echo performed with patient supine and on artificial respirator. IMPRESSIONS  1. Poor acoustic windows Endocardium is difficult to see Overall LVEF is porbably low normal with hypokinesis of the base /mid inferior/inferolateral walls. Would recomm limited echo with Definiity to further define wall motion.. Left ventricular diastolic parameters are indeterminate.  2. Right ventricular systolic function is normal. The right ventricular size is normal. There is moderately elevated pulmonary artery systolic pressure.  3. The aortic valve is grossly normal. Aortic valve regurgitation is not visualized.  4. The inferior vena cava is dilated in size with <50% respiratory variability, suggesting right atrial pressure of 15 mmHg. FINDINGS  Left Ventricle: Poor acoustic windows Endocardium is difficult to see Overall LVEF is porbably low normal with hypokinesis of the base /mid inferior/inferolateral walls. Would recomm limited echo with Definiity to further define wall motion. Left ventricular diastolic parameters are indeterminate. Right Ventricle: The right ventricular size is normal. Right ventricular systolic function is normal. There is moderately elevated pulmonary artery systolic pressure. The tricuspid regurgitant velocity is 2.74 m/s, and with an assumed right atrial pressure of 15 mmHg, the estimated right ventricular systolic pressure is 45.0 mmHg. Left Atrium: Left atrial size was normal in size. Right Atrium: Right atrial size was normal in size.  Pericardium: There is no evidence of pericardial effusion. Tricuspid Valve: The tricuspid valve is not well visualized. Tricuspid valve regurgitation is mild. Aortic Valve: The aortic valve is grossly normal. Aortic valve regurgitation is not visualized. Pulmonic Valve: The pulmonic valve was normal in structure. Pulmonic valve regurgitation is not visualized. Venous: The inferior vena cava is dilated in size with less than 50% respiratory variability, suggesting right atrial pressure of 15 mmHg. LEFT ATRIUM           Index LA Vol (A4C): 23.4 ml 12.24 ml/m  TRICUSPID VALVE TR Peak grad:   30.0 mmHg TR Vmax:        274.00 cm/s Dietrich Pates MD Electronically signed by Dietrich Pates MD Signature Date/Time: 02/03/2020/12:16:33 PM    Final      Assessment and Plan:   1. Elevated troponin:   Likely demand ischemia.   He has been hypoxic in under streaming distress for several days. His EKG shows atrial fibrillation but no acute ST or T wave changes.  Do not think this is an acute plan rupture. Echo shows low-normal LV function.   Not suggestive of a plaque rupture.   His prognosis is quite poor.  He remains hypoxic.  Acidotic  On high dose Levo     2.   Atrial fib:   No previous hx of Afib listed. CHADS2VASC is at least 5    ( ae 79, HTN, CAD, CHF)  On heparin . HR seems to be well controlled.  Cont heparin ,  Cont rate control   3.  Covid pneumonia:   Has ARDS.  Is acidotic.  O2 sats are ok but with 100% FIO2 on the vent . He has minimal urine output     Continue supportive care . No further suggestions.       For questions or updates, please contact [facility] Please consult [email or web address] for contact info under    Signed, Kristeen Miss, MD  02/03/2020 3:33 PM

## 2020-02-03 NOTE — Progress Notes (Signed)
Pt. Transported to CT and back without any complications. °

## 2020-02-03 NOTE — Progress Notes (Signed)
ANTICOAGULATION CONSULT NOTE - Initial Consult  Pharmacy Consult for heparin Indication: chest pain/ACS  Not on File  Patient Measurements: Height: 5\' 2"  (157.5 cm) IBW/kg (Calculated) : 54.6 Heparin Dosing Weight: 90.7 kg  Vital Signs: Temp: 100.7 F (38.2 C) (12/09 0120) Temp Source: Rectal (12/09 0120) BP: 97/59 (12/09 0415) Pulse Rate: 70 (12/09 0415)  Labs: Recent Labs    01/31/2020 2325 02/23/2020 2335 02/01/2020 2347  HGB 14.1  --  12.9*  HCT 43.0  --  38.0*  PLT 263  --   --   APTT  --  34  --   LABPROT 14.1  --   --   INR 1.1  --   --   CREATININE 2.58*  --   --   TROPONINIHS 1,439*  --   --     CrCl cannot be calculated (Unknown ideal weight.).   Medical History: Past Medical History:  Diagnosis Date  . Hypertension   . Ischemic cardiomyopathy   . Pacemaker    medtronic    Medications:  unknown Assessment: 79 yo man to start heparin for ACS.  He was not on anticoagualtion PTA.  Hg 12.9, PTLC 263 Goal of Therapy:  Heparin level 0.3-0.7 units/ml Monitor platelets by anticoagulation protocol: Yes   Plan:  Heparin bolus 4000 units and drip at 1000 units/hr Check heparin level 6-8 hours after start Daily HL and CBC while on heparin Monitor for bleeding complications  Ting Cage Poteet 02/03/2020,4:51 AM

## 2020-02-03 NOTE — Sepsis Progress Note (Signed)
Did not receive iv fluid resuscitation. Per Dr Jolyn Nap note:  "He has only had 1 brief episode of hypotension that immediately resolved after rechecking blood pressure.  Will hold on fluids at this time given he has COVID-19.  We will also cancel antibiotics. " Continue to follow sepsis for remainder of time.

## 2020-02-03 NOTE — Progress Notes (Signed)
eLink Physician-Brief Progress Note Patient Name: Lucas Baswell Sr. DOB: [birth year] MRN: [id number]   Date of Service  02/03/2020  HPI/Events of Note  Patient needs a bilateral wrist restraints order to prevent self-extubation.  eICU Interventions  Bilateral wrist restraints ordered.        Thomasene Lot Cree Kunert 02/03/2020, 9:56 PM

## 2020-02-03 NOTE — Procedures (Signed)
Intubation Procedure Note  Lucas Clay  [id number]  Nov 15, 1940  Date:02/03/20  Time:10:49 AM   Provider Performing:Pete E Tanja Port    Procedure: Intubation (31500)  Indication(s) Respiratory Failure  Consent Risks of the procedure as well as the alternatives and risks of each were explained to the patient and/or caregiver.  Consent for the procedure was obtained and is signed in the bedside chart   Anesthesia Etomidate, Versed, Fentanyl and Rocuronium   Time Out Verified patient identification, verified procedure, site/side was marked, verified correct patient position, special equipment/implants available, medications/allergies/relevant history reviewed, required imaging and test results available.   Sterile Technique Usual hand hygeine, masks, and gloves were used   Procedure Description Patient positioned in bed supine.  Sedation given as noted above.  Patient was intubated with endotracheal tube using Glidescope.  View was Grade 3 only epiglottis .  Number of attempts was 1.  Colorimetric CO2 detector was consistent with tracheal placement.   Complications/Tolerance None; patient tolerated the procedure well. Chest X-ray is ordered to verify placement.   EBL Minimal   Specimen(s) None Simonne Martinet ACNP-BC [facility] Pulmonary/Critical Care Pager # [phone] OR # [phone] if no answer

## 2020-02-03 NOTE — Procedures (Signed)
Central Venous Catheter Insertion Procedure Note  Lucas Keesling Sr.  [id number]  1940/04/24  Date:02/03/20  Time:10:49 AM   Provider Performing:Lucas Clay   Procedure: Insertion of Non-tunneled Central Venous Catheter(36556) with US guidance (29937)   Indication(s) Medication administration and Difficult access  Consent Unable to obtain consent due to emergent nature of procedure.  Anesthesia Topical only with 1% lidocaine   Timeout Verified patient identification, verified procedure, site/side was marked, verified correct patient position, special equipment/implants available, medications/allergies/relevant history reviewed, required imaging and test results available.  Sterile Technique Maximal sterile technique including full sterile barrier drape, hand hygiene, sterile gown, sterile gloves, mask, hair covering, sterile ultrasound probe cover (if used).  Procedure Description Area of catheter insertion was cleaned with chlorhexidine and draped in sterile fashion.  With real-time ultrasound guidance a central venous catheter was placed into the right internal jugular vein. Nonpulsatile blood flow and easy flushing noted in all ports.  The catheter was sutured in place and sterile dressing applied.  Complications/Tolerance None; patient tolerated the procedure well. Chest X-ray is ordered to verify placement for internal jugular or subclavian cannulation.   Chest x-ray is not ordered for femoral cannulation.  EBL Minimal  Specimen(s) None  Lucas Clay ACNP-BC [facility] Pulmonary/Critical Care Pager # [phone] OR # [phone] if no answer

## 2020-02-03 NOTE — Plan of Care (Signed)
  Problem: Education: Goal: Knowledge of risk factors and measures for prevention of condition will improve Outcome: Progressing   Problem: Coping: Goal: Psychosocial and spiritual needs will be supported Outcome: Progressing   Problem: Respiratory: Goal: Will maintain a patent airway Outcome: Progressing Goal: Complications related to the disease process, condition or treatment will be avoided or minimized Outcome: Progressing   

## 2020-02-03 NOTE — H&P (Signed)
History and Physical    [name] LHT:[id number] DOB: 1940-10-20 DOA: February 08, 2020  PCP: Patient, No Pcp Per   Patient coming from:   Home  Chief Complaint: Confusion, difficulty ambulating, shortness of breath  HPI: Lucas de La Chapman Moss Sr. is a 79 y.o. male with history of CAD, cardiomyopathy, hypertension and pacemaker.  Patient does not speak English and is currently on BiPAP and not able to provide any history.  He is also very hard of hearing.  His son was in the emergency room earlier and gave history to the ER physician but is no longer here.  I tried to call the son but did not get an answer.  History was obtained from the ER provider and chart. Patient was seen on Saturday, December 4 in the emergency room with altered mental status.  At that time is reportedly had confusion and difficulty walking but was sent home from the ER.  His son reported that yesterday he fell in the bathtub and hit his head and he has had increased lethargy, confusion and shortness of breath over the last day.  Reportedly he has been vaccinated for COVID-19 with 2 doses of the vaccine.  He has been in [place] for the last 4 months visiting family.  He lives in [place].  ED Course: Patient was found to be hypoxic with O2 sats in the mid 50s on room air when he initially presented.  He was tachypneic and confused. He is COVID postive and CXR consistent with COVID pneumonia. Started on Remdisivir and steroids.   Review of Systems:  Patient unable to provide review of systems secondary to acute medical condition with confusion and on BiPAP  Past Medical History:  Diagnosis Date  . Hypertension   . Ischemic cardiomyopathy   . Pacemaker    medtronic    Past Surgical History:  Procedure Laterality Date  . CARDIAC CATHETERIZATION      Social History  reports that he has never smoked. He has never used smokeless tobacco. He reports that he does not drink alcohol and  does not use drugs.  Not on File  History reviewed. No pertinent family history.   Prior to Admission medications   Not on File    Physical Exam: Vitals:   02/03/20 0307 02/03/20 0315 02/03/20 0345 02/03/20 0415  BP:  90/72 91/68 (!) 97/59  Pulse:  85 85 70  Resp:  19 (!) 28 (!) 29  Temp:      TempSrc:      SpO2:  100% 98% 98%  Height: 5\' 2"  (1.575 m)       Constitutional: NAD, calm, comfortable Vitals:   02/03/20 0307 02/03/20 0315 02/03/20 0345 02/03/20 0415  BP:  90/72 91/68 (!) 97/59  Pulse:  85 85 70  Resp:  19 (!) 28 (!) 29  Temp:      TempSrc:      SpO2:  100% 98% 98%  Height: 5\' 2"  (1.575 m)      General: WDWN.  Opens eyes to tactile stimulation but does not verbally answer Eyes: PERRL, conjunctivae normal.  Sclera nonicteric HENT:  Lucas Clay, external ears normal.  Nares patent without epistasis.  External mouth no lesions. Neck: Soft, normal passive range of motion, supple, no masses, no thyromegaly.  Trachea midline Respiratory: Tachypnea. diminished breath sounds bilaterally with bibasilar crackles., no wheezing, no rhonchi. Normal respiratory effort on BiPAP. No accessory muscle use.  Cardiovascular: Irregularly irregular rhythm with normal rate.  No murmurs / rubs / gallops. No extremity edema. 2+ pedal pulses. No carotid bruits.  Abdomen: Soft, no tenderness, nondistended, no rebound or guarding.  No masses palpated. Bowel sounds normoactive Musculoskeletal: Passive range of motion.  No clubbing / cyanosis. No joint deformity upper and lower extremities.  Skin: Warm, dry, intact no rashes, lesions, ulcers. No induration Neurologic:  Moves all extremities spontaneously.  Babinski downgoing bilaterally.  Grip strength weak but equal    Labs on Admission: I have personally reviewed following labs and imaging studies  CBC: Recent Labs  Lab 02-21-2020 2325 2020-02-21 2347  WBC 17.6*  --   NEUTROABS 15.6*  --   HGB 14.1 12.9*  HCT 43.0 38.0*  MCV 90.0  --    PLT 263  --     Basic Metabolic Panel: Recent Labs  Lab Feb 21, 2020 2325 02-21-20 2347  NA 128* 127*  K 3.3* 2.8*  CL 93*  --   CO2 19*  --   GLUCOSE 129*  --   BUN 66*  --   CREATININE 2.58*  --   CALCIUM 8.2*  --     GFR: CrCl cannot be calculated (Unknown ideal weight.).  Liver Function Tests: Recent Labs  Lab 21-Feb-2020 2325  AST 139*  ALT 42  ALKPHOS 51  BILITOT 1.7*  PROT 7.2  ALBUMIN 2.9*    Urine analysis: No results found for: COLORURINE, APPEARANCEUR, LABSPEC, PHURINE, GLUCOSEU, HGBUR, BILIRUBINUR, KETONESUR, PROTEINUR, UROBILINOGEN, NITRITE, LEUKOCYTESUR  Radiological Exams on Admission: CT Head Wo Contrast  Result Date: 02/03/2020 CLINICAL DATA:  Status post fall. EXAM: CT HEAD WITHOUT CONTRAST TECHNIQUE: Contiguous axial images were obtained from the base of the skull through the vertex without intravenous contrast. COMPARISON:  None. FINDINGS: Brain: There is mild to moderate severity cerebral atrophy with widening of the extra-axial spaces and ventricular dilatation. There are areas of decreased attenuation within the white matter tracts of the supratentorial brain, consistent with microvascular disease changes. Vascular: No hyperdense vessel or unexpected calcification. Skull: Normal. Negative for fracture or focal lesion. Sinuses/Orbits: There is mild to moderate severity bilateral ethmoid sinus mucosal thickening. Other: None. IMPRESSION: 1. Generalized atrophy and microvascular disease changes of the supratentorial brain. 2. No acute intracranial abnormality. 3. Mild to moderate severity bilateral ethmoid sinus disease. Electronically Signed   By: Aram Candela M.D.   On: 02/03/2020 00:23   DG Chest Portable 1 View  Result Date: 02/21/2020 CLINICAL DATA:  Hypoxia EXAM: PORTABLE CHEST 1 VIEW COMPARISON:  None. FINDINGS: Post sternotomy changes. Left-sided pacing device with leads projecting over right atrium and slightly unusual course of additional lead  possibly over coronary sinus region. Mild cardiomegaly with aortic atherosclerosis. Extensive bilateral airspace disease with consolidations and ground-glass opacities. No pleural effusion or pneumothorax. IMPRESSION: 1. Extensive bilateral airspace disease with consolidations and ground-glass opacities, favored to represent bilateral pneumonia. 2. Mild cardiomegaly. Electronically Signed   By: Jasmine Pang M.D.   On: 02/21/20 23:43    EKG: Independently reviewed.  EKG shows atrial fibrillation with RVR.  Nonspecific ST changes but no acute ST elevation or depression.  QTc is normal at 443.  Heart rate now is 80-90 on monitor  Assessment/Plan Principal Problem:   Pneumonia due to COVID-19 virus Patient be admitted to progressive care unit under Covid protocols.  He is placed in a negative pressure room on BiPAP and oxygenation has improved with BiPAP therapy.  RT to follow. Started on remdesivir and Solu-Medrol for Covid pneumonia Monitor inflammatory labs for Covid.  Monitor blood sugars Pt with elevated D-dimer and CTA chest ordered but cannot be obtained due to renal function at this time.  Active Problems:   Acute respiratory failure with hypoxia (HCC) Improved on BiPAP.  Will wean as tolerated.    Sepsis [facility]) Patient meets sepsis criteria with Covid pneumonia with hypoxia and altered mental status.    Atrial fibrillation with RVR (HCC) Initial EKG shows atrial fibrillation with RVR.  Patient is now as atrial fibrillation with controlled rate on telemetry monitor.  We will check serial EKGs over the morning    NSTEMI (non-ST elevated myocardial infarction) (HCC)  Initial troponin level was greater than 1400.  Cardiology's been consulted and cardiology team will see patient in the morning.  We will check serial troponin levels through the night.  Patient is not able to provide any history at this time due to his acute medical condition and being on BiPAP.  No family is at bedside  currently.    Altered mental status Continue to monitor    DVT prophylaxis: Heparin infusion for anticoagulation.  Code Status:   Full Code  Family Communication:  No family at bedside at this time. Called but did not get an answer.  Disposition Plan:   Patient is from:  Home  Anticipated DC to:  home  Anticipated DC date:  Anticipate greater than two midnight stay in hospital to treat acute      condition.  Anticipated DC barriers: Pt does not speak english and is hard of hearing. May need rehab      placement. Will determine after further workup and improvement of      condition.   Consults called:  Cardiology. Discussed with on call cardiologist and cardiology team will see in am.  Admission status:  Inpatient.   Claudean Severance Kelby Adell MD [facility]  How to contact the [facility] Attending or Consulting provider 7A - 7P or covering provider during after hours 7P -7A, for this patient?   1. Check the care team in [facility] and look for a) attending/consulting TR[facility] provider listed and b) the [facility] team listed 2. Log into [email or web address] and use [place]'s universal password to access. If you do not have the password, please contact the hospital operator. 3. Locate the [facility] provider you are looking for under [facility] and page to a number that you can be directly reached. 4. If you still have difficulty reaching the provider, please page the [facility] (Director on Call) for the Hospitalists listed on amion for assistance.  02/03/2020, 4:43 AM

## 2020-02-03 NOTE — Progress Notes (Signed)
  Echocardiogram 2D Echocardiogram has been performed.  [name] 02/03/2020, 11:41 AM

## 2020-02-03 NOTE — Progress Notes (Signed)
Assisted tele visit to patient with family member.  Raylee Adamec D Liesl Simons, RN   

## 2020-02-03 NOTE — Progress Notes (Signed)
RT note. Pt. Transported from ED to 20M rm 9 without any complications.

## 2020-02-03 NOTE — Sepsis Progress Note (Signed)
Sepsis monitoring complete 

## 2020-02-04 ENCOUNTER — Other Ambulatory Visit: Payer: Self-pay

## 2020-02-04 ENCOUNTER — Inpatient Hospital Stay (HOSPITAL_BASED_OUTPATIENT_CLINIC_OR_DEPARTMENT_OTHER): Payer: HRSA Program

## 2020-02-04 DIAGNOSIS — R7989 Other specified abnormal findings of blood chemistry: Secondary | ICD-10-CM | POA: Diagnosis not present

## 2020-02-04 DIAGNOSIS — U071 COVID-19: Secondary | ICD-10-CM | POA: Diagnosis not present

## 2020-02-04 LAB — GLUCOSE, CAPILLARY
Glucose-Capillary: 167 mg/dL — ABNORMAL HIGH (ref 70–99)
Glucose-Capillary: 177 mg/dL — ABNORMAL HIGH (ref 70–99)
Glucose-Capillary: 181 mg/dL — ABNORMAL HIGH (ref 70–99)
Glucose-Capillary: 183 mg/dL — ABNORMAL HIGH (ref 70–99)
Glucose-Capillary: 191 mg/dL — ABNORMAL HIGH (ref 70–99)
Glucose-Capillary: 197 mg/dL — ABNORMAL HIGH (ref 70–99)

## 2020-02-04 LAB — BLOOD CULTURE ID PANEL (REFLEXED) - BCID2

## 2020-02-04 LAB — CBC WITH DIFFERENTIAL/PLATELET
Abs Immature Granulocytes: 0.1 10*3/uL — ABNORMAL HIGH (ref 0.00–0.07)
Basophils Absolute: 0 10*3/uL (ref 0.0–0.1)
Basophils Relative: 0 %
Eosinophils Absolute: 0 10*3/uL (ref 0.0–0.5)
Eosinophils Relative: 0 %
HCT: 40.7 % (ref 39.0–52.0)
Hemoglobin: 13.3 g/dL (ref 13.0–17.0)
Immature Granulocytes: 1 %
Lymphocytes Relative: 3 %
Lymphs Abs: 0.4 10*3/uL — ABNORMAL LOW (ref 0.7–4.0)
MCH: 29.6 pg (ref 26.0–34.0)
MCHC: 32.7 g/dL (ref 30.0–36.0)
MCV: 90.4 fL (ref 80.0–100.0)
Monocytes Absolute: 0.4 10*3/uL (ref 0.1–1.0)
Monocytes Relative: 3 %
Neutro Abs: 12.8 10*3/uL — ABNORMAL HIGH (ref 1.7–7.7)
Neutrophils Relative %: 93 %
Platelets: 237 10*3/uL (ref 150–400)
RBC: 4.5 MIL/uL (ref 4.22–5.81)
RDW: 14 % (ref 11.5–15.5)
WBC: 13.7 10*3/uL — ABNORMAL HIGH (ref 4.0–10.5)
nRBC: 0 % (ref 0.0–0.2)

## 2020-02-04 LAB — POCT I-STAT 7, (LYTES, BLD GAS, ICA,H+H)
Acid-base deficit: 4 mmol/L — ABNORMAL HIGH (ref 0.0–2.0)
Bicarbonate: 20.9 mmol/L (ref 20.0–28.0)
Calcium, Ion: 1.1 mmol/L — ABNORMAL LOW (ref 1.15–1.40)
HCT: 36 % — ABNORMAL LOW (ref 39.0–52.0)
Hemoglobin: 12.2 g/dL — ABNORMAL LOW (ref 13.0–17.0)
O2 Saturation: 91 %
Patient temperature: 98.6
Potassium: 3.9 mmol/L (ref 3.5–5.1)
Sodium: 135 mmol/L (ref 135–145)
TCO2: 22 mmol/L (ref 22–32)
pCO2 arterial: 36.5 mmHg (ref 32.0–48.0)
pH, Arterial: 7.365 (ref 7.350–7.450)
pO2, Arterial: 63 mmHg — ABNORMAL LOW (ref 83.0–108.0)

## 2020-02-04 LAB — COMPREHENSIVE METABOLIC PANEL
ALT: 32 U/L (ref 0–44)
AST: 57 U/L — ABNORMAL HIGH (ref 15–41)
Albumin: 2 g/dL — ABNORMAL LOW (ref 3.5–5.0)
Alkaline Phosphatase: 50 U/L (ref 38–126)
Anion gap: 14 (ref 5–15)
BUN: 78 mg/dL — ABNORMAL HIGH (ref 8–23)
CO2: 20 mmol/L — ABNORMAL LOW (ref 22–32)
Calcium: 7.8 mg/dL — ABNORMAL LOW (ref 8.9–10.3)
Chloride: 100 mmol/L (ref 98–111)
Creatinine, Ser: 2.1 mg/dL — ABNORMAL HIGH (ref 0.61–1.24)
GFR, Estimated: 31 mL/min — ABNORMAL LOW (ref >60)
Glucose, Bld: 190 mg/dL — ABNORMAL HIGH (ref 70–99)
Potassium: 3.8 mmol/L (ref 3.5–5.1)
Sodium: 134 mmol/L — ABNORMAL LOW (ref 135–145)
Total Bilirubin: 0.9 mg/dL (ref 0.3–1.2)
Total Protein: 5.8 g/dL — ABNORMAL LOW (ref 6.5–8.1)

## 2020-02-04 LAB — FERRITIN: Ferritin: 870 ng/mL — ABNORMAL HIGH (ref 24–336)

## 2020-02-04 LAB — MRSA PCR SCREENING: MRSA by PCR: POSITIVE — AB

## 2020-02-04 LAB — MAGNESIUM
Magnesium: 2.1 mg/dL (ref 1.7–2.4)
Magnesium: 2.4 mg/dL (ref 1.7–2.4)

## 2020-02-04 LAB — PHOSPHORUS
Phosphorus: 5.7 mg/dL — ABNORMAL HIGH (ref 2.5–4.6)
Phosphorus: 5.4 mg/dL — ABNORMAL HIGH (ref 2.5–4.6)

## 2020-02-04 LAB — LACTIC ACID, PLASMA
Lactic Acid, Venous: 1.8 mmol/L (ref 0.5–1.9)
Lactic Acid, Venous: 1.5 mmol/L (ref 0.5–1.9)

## 2020-02-04 LAB — HEMOGLOBIN A1C
Hgb A1c MFr Bld: 6.8 % — ABNORMAL HIGH (ref 4.8–5.6)
Mean Plasma Glucose: 148.46 mg/dL

## 2020-02-04 LAB — PROCALCITONIN: Procalcitonin: 1.7 ng/mL

## 2020-02-04 LAB — D-DIMER, QUANTITATIVE: D-Dimer, Quant: 2.36 ug/mL-FEU — ABNORMAL HIGH (ref 0.00–0.50)

## 2020-02-04 LAB — C-REACTIVE PROTEIN: CRP: 22.4 mg/dL — ABNORMAL HIGH (ref <1.0)

## 2020-02-04 LAB — TRIGLYCERIDES: Triglycerides: 70 mg/dL (ref <150)

## 2020-02-04 LAB — APTT: aPTT: 68 seconds — ABNORMAL HIGH (ref 24–36)

## 2020-02-04 MED ORDER — DOCUSATE SODIUM 50 MG/5ML PO LIQD
100.0000 mg | Freq: Two times a day (BID) | ORAL | Status: DC
  Administered 2020-02-04 – 2020-02-06 (×5): 100 mg
  Filled 2020-02-04 (×6): qty 10

## 2020-02-04 MED ORDER — ATORVASTATIN CALCIUM 10 MG PO TABS
20.0000 mg | ORAL_TABLET | Freq: Every day | ORAL | Status: DC
  Administered 2020-02-04 – 2020-02-12 (×9): 20 mg
  Filled 2020-02-04 (×9): qty 2

## 2020-02-04 MED ORDER — FREE WATER
175.0000 mL | Freq: Four times a day (QID) | Status: DC
  Administered 2020-02-04 – 2020-02-10 (×23): 175 mL

## 2020-02-04 MED ORDER — VITAL HIGH PROTEIN PO LIQD
1000.0000 mL | ORAL | Status: DC
  Filled 2020-02-04: qty 1000

## 2020-02-04 MED ORDER — VITAL AF 1.2 CAL PO LIQD
1000.0000 mL | ORAL | Status: DC
  Administered 2020-02-04: 1000 mL
  Filled 2020-02-04 (×6): qty 1000

## 2020-02-04 MED ORDER — PROSOURCE TF PO LIQD
45.0000 mL | Freq: Three times a day (TID) | ORAL | Status: DC
  Administered 2020-02-04 – 2020-02-11 (×22): 45 mL
  Filled 2020-02-04 (×21): qty 45

## 2020-02-04 MED ORDER — ONDANSETRON HCL 4 MG/2ML IJ SOLN: 4.0000 mg | Freq: Four times a day (QID) | INTRAMUSCULAR | Status: DC | PRN

## 2020-02-04 MED ORDER — MIDAZOLAM HCL 2 MG/2ML IJ SOLN
1.0000 mg | INTRAMUSCULAR | Status: DC | PRN
  Administered 2020-02-08: 1 mg via INTRAVENOUS
  Filled 2020-02-04 (×2): qty 2

## 2020-02-04 MED ORDER — HYDROCOD POLST-CPM POLST ER 10-8 MG/5ML PO SUER: 5.0000 mL | Freq: Two times a day (BID) | ORAL | Status: DC | PRN

## 2020-02-04 MED ORDER — SODIUM CHLORIDE 0.9% FLUSH: 10.0000 mL | INTRAVENOUS | Status: DC | PRN

## 2020-02-04 MED ORDER — MIDAZOLAM HCL 2 MG/2ML IJ SOLN
1.0000 mg | INTRAMUSCULAR | Status: DC | PRN
  Administered 2020-02-05: 04:00:00 1 mg via INTRAVENOUS

## 2020-02-04 MED ORDER — INSULIN ASPART 100 UNIT/ML ~~LOC~~ SOLN
0.0000 [IU] | SUBCUTANEOUS | Status: DC
  Administered 2020-02-04 – 2020-02-05 (×6): 3 [IU] via SUBCUTANEOUS
  Administered 2020-02-05 (×2): 5 [IU] via SUBCUTANEOUS
  Administered 2020-02-05: 17:00:00 3 [IU] via SUBCUTANEOUS
  Administered 2020-02-05: 20:00:00 5 [IU] via SUBCUTANEOUS
  Administered 2020-02-06: 3 [IU] via SUBCUTANEOUS
  Administered 2020-02-06 (×2): 5 [IU] via SUBCUTANEOUS
  Administered 2020-02-06 (×2): 3 [IU] via SUBCUTANEOUS
  Administered 2020-02-06: 09:00:00 8 [IU] via SUBCUTANEOUS
  Administered 2020-02-07 (×2): 5 [IU] via SUBCUTANEOUS
  Administered 2020-02-07: 20:00:00 3 [IU] via SUBCUTANEOUS
  Administered 2020-02-07 – 2020-02-08 (×3): 5 [IU] via SUBCUTANEOUS
  Administered 2020-02-08 (×2): 3 [IU] via SUBCUTANEOUS
  Administered 2020-02-08: 13:00:00 5 [IU] via SUBCUTANEOUS
  Administered 2020-02-08 (×2): 8 [IU] via SUBCUTANEOUS
  Administered 2020-02-09: 16:00:00 3 [IU] via SUBCUTANEOUS
  Administered 2020-02-09: 5 [IU] via SUBCUTANEOUS
  Administered 2020-02-09: 20:00:00 3 [IU] via SUBCUTANEOUS
  Administered 2020-02-09 (×2): 2 [IU] via SUBCUTANEOUS
  Administered 2020-02-10 (×3): 3 [IU] via SUBCUTANEOUS
  Administered 2020-02-10: 12:00:00 2 [IU] via SUBCUTANEOUS
  Administered 2020-02-10 – 2020-02-11 (×2): 3 [IU] via SUBCUTANEOUS
  Administered 2020-02-11: 05:00:00 2 [IU] via SUBCUTANEOUS
  Administered 2020-02-11 (×2): 3 [IU] via SUBCUTANEOUS

## 2020-02-04 MED ORDER — MIDAZOLAM HCL 2 MG/2ML IJ SOLN
2.0000 mg | Freq: Once | INTRAMUSCULAR | Status: AC
  Administered 2020-02-04: 2 mg via INTRAVENOUS

## 2020-02-04 MED ORDER — SODIUM CHLORIDE 0.9 % IV SOLN
INTRAVENOUS | Status: DC | PRN
  Administered 2020-02-04: 250 mL via INTRAVENOUS
  Administered 2020-02-09: 12:00:00 1000 mL via INTRAVENOUS

## 2020-02-04 MED ORDER — PROSOURCE TF PO LIQD
45.0000 mL | Freq: Two times a day (BID) | ORAL | Status: DC
  Administered 2020-02-04: 45 mL
  Filled 2020-02-04: qty 45

## 2020-02-04 MED ORDER — POLYETHYLENE GLYCOL 3350 17 G PO PACK
17.0000 g | PACK | Freq: Every day | ORAL | Status: DC
  Administered 2020-02-04 – 2020-02-06 (×3): 17 g
  Filled 2020-02-04 (×4): qty 1

## 2020-02-04 MED ORDER — GUAIFENESIN-DM 100-10 MG/5ML PO SYRP: 10.0000 mL | ORAL_SOLUTION | ORAL | Status: DC | PRN

## 2020-02-04 MED ORDER — ONDANSETRON HCL 4 MG PO TABS: 4.0000 mg | ORAL_TABLET | Freq: Four times a day (QID) | ORAL | Status: DC | PRN

## 2020-02-04 MED ORDER — MUPIROCIN 2 % EX OINT
1.0000 "application " | TOPICAL_OINTMENT | Freq: Two times a day (BID) | CUTANEOUS | Status: AC
  Administered 2020-02-04 – 2020-02-08 (×10): 1 via NASAL
  Filled 2020-02-04 (×4): qty 22

## 2020-02-04 MED ORDER — METHYLPREDNISOLONE SODIUM SUCC 125 MG IJ SOLR
1.0000 mg/kg | Freq: Two times a day (BID) | INTRAMUSCULAR | Status: AC
  Administered 2020-02-04 – 2020-02-05 (×3): 90.625 mg via INTRAVENOUS
  Filled 2020-02-04 (×3): qty 2

## 2020-02-04 MED ORDER — PROPOFOL 1000 MG/100ML IV EMUL
5.0000 ug/kg/min | INTRAVENOUS | Status: DC
  Administered 2020-02-04: 10 ug/kg/min via INTRAVENOUS
  Administered 2020-02-04: 20 ug/kg/min via INTRAVENOUS
  Administered 2020-02-05 (×2): 30 ug/kg/min via INTRAVENOUS
  Administered 2020-02-05: 01:00:00 20 ug/kg/min via INTRAVENOUS
  Administered 2020-02-06 – 2020-02-07 (×6): 30 ug/kg/min via INTRAVENOUS
  Filled 2020-02-04 (×12): qty 100

## 2020-02-04 MED ORDER — CHLORHEXIDINE GLUCONATE 0.12% ORAL RINSE (MEDLINE KIT)
15.0000 mL | Freq: Two times a day (BID) | OROMUCOSAL | Status: DC
  Administered 2020-02-04 – 2020-02-12 (×17): 15 mL via OROMUCOSAL

## 2020-02-04 MED ORDER — MIDAZOLAM HCL 2 MG/2ML IJ SOLN
INTRAMUSCULAR | Status: AC
  Filled 2020-02-04: qty 2

## 2020-02-04 MED ORDER — ORAL CARE MOUTH RINSE
15.0000 mL | OROMUCOSAL | Status: DC
  Administered 2020-02-04 – 2020-02-11 (×75): 15 mL via OROMUCOSAL

## 2020-02-04 MED ORDER — SODIUM CHLORIDE 0.9% FLUSH
10.0000 mL | Freq: Two times a day (BID) | INTRAVENOUS | Status: DC
  Administered 2020-02-04 (×2): 10 mL
  Administered 2020-02-04: 20 mL
  Administered 2020-02-05 – 2020-02-11 (×12): 10 mL

## 2020-02-04 MED ORDER — ASPIRIN 81 MG PO CHEW
81.0000 mg | CHEWABLE_TABLET | Freq: Every day | ORAL | Status: DC
  Administered 2020-02-04 – 2020-02-07 (×3): 81 mg
  Filled 2020-02-04 (×3): qty 1

## 2020-02-04 MED ORDER — PREDNISONE 20 MG PO TABS
50.0000 mg | ORAL_TABLET | Freq: Every day | ORAL | Status: DC
  Administered 2020-02-06 – 2020-02-08 (×3): 50 mg
  Filled 2020-02-04 (×3): qty 1

## 2020-02-04 NOTE — Progress Notes (Signed)
Assisted tele visit to patient with family member.  Lucas Clay Ann, RN  

## 2020-02-04 NOTE — Progress Notes (Signed)
Assisted tele visit to patient with family member.  Shaliah Wann Harold, RN  

## 2020-02-04 NOTE — Progress Notes (Signed)
PHARMACY - PHYSICIAN COMMUNICATION CRITICAL VALUE ALERT - BLOOD CULTURE IDENTIFICATION (BCID)  Lucas de La Chapman Moss Sr. is an 79 y.o. male who presented to [facility] on Feb 08, 2020 with a chief complaint of COVID 19  Assessment:  Pt with 1/4 bottles growing staph epidermidis - likely contaminant  Name of physician (or Provider) Contacted: Dr. Warrick Parisian  Current antibiotics: Azithromycin and Ceftriaxone  Changes to prescribed antibiotics recommended:  No additional abx needed  Results for orders placed or performed during the hospital encounter of 02/08/2020  Blood Culture ID Panel (Reflexed) (Collected: 02/03/2020 12:30 AM)  Result Value Ref Range   Enterococcus faecalis NOT DETECTED NOT DETECTED   Enterococcus Faecium NOT DETECTED NOT DETECTED   Listeria monocytogenes NOT DETECTED NOT DETECTED   Staphylococcus species DETECTED (A) NOT DETECTED   Staphylococcus aureus (BCID) NOT DETECTED NOT DETECTED   Staphylococcus epidermidis DETECTED (A) NOT DETECTED   Staphylococcus lugdunensis NOT DETECTED NOT DETECTED   Streptococcus species NOT DETECTED NOT DETECTED   Streptococcus agalactiae NOT DETECTED NOT DETECTED   Streptococcus pneumoniae NOT DETECTED NOT DETECTED   Streptococcus pyogenes NOT DETECTED NOT DETECTED   A.calcoaceticus-baumannii NOT DETECTED NOT DETECTED   Bacteroides fragilis NOT DETECTED NOT DETECTED   Enterobacterales NOT DETECTED NOT DETECTED   Enterobacter cloacae complex NOT DETECTED NOT DETECTED   Escherichia coli NOT DETECTED NOT DETECTED   Klebsiella aerogenes NOT DETECTED NOT DETECTED   Klebsiella oxytoca NOT DETECTED NOT DETECTED   Klebsiella pneumoniae NOT DETECTED NOT DETECTED   Proteus species NOT DETECTED NOT DETECTED   Salmonella species NOT DETECTED NOT DETECTED   Serratia marcescens NOT DETECTED NOT DETECTED   Haemophilus influenzae NOT DETECTED NOT DETECTED   Neisseria meningitidis NOT DETECTED NOT DETECTED   Pseudomonas aeruginosa NOT DETECTED  NOT DETECTED   Stenotrophomonas maltophilia NOT DETECTED NOT DETECTED   Candida albicans NOT DETECTED NOT DETECTED   Candida auris NOT DETECTED NOT DETECTED   Candida glabrata NOT DETECTED NOT DETECTED   Candida krusei NOT DETECTED NOT DETECTED   Candida parapsilosis NOT DETECTED NOT DETECTED   Candida tropicalis NOT DETECTED NOT DETECTED   Cryptococcus neoformans/gattii NOT DETECTED NOT DETECTED   Methicillin resistance mecA/C NOT DETECTED NOT DETECTED    Christoper Fabian, PharmD, BCPS Please see amion for complete clinical pharmacist phone list 02/04/2020  1:11 AM

## 2020-02-04 NOTE — Progress Notes (Signed)
Bilateral lower extremity venous duplex completed. Refer to "CV Proc" under chart review to view preliminary results.  02/04/2020 2:35 PM Eula Fried., MHA, RVT, RDCS, RDMS

## 2020-02-04 NOTE — Progress Notes (Signed)
Pt is critically ill with COVID, ARDS, respiratory failure Not a candidate for cardiac procedures Continue supportive care per [facility]  Cardiology will sign off. Call for questions.     Kristeen Miss, MD  02/04/2020 10:19 AM    [facility] [address] [place],  [address] [place], [place]  [address] Phone: [phone]; Fax: [phone]

## 2020-02-04 NOTE — Consult Note (Signed)
NAME:  Lucas Whang., MRN:  [id number], DOB:  1940/06/25, LOS: 1 ADMISSION DATE:  02/20/2020, CONSULTATION DATE:  02/03/20 REFERRING MD:  Jerral Ralph  CHIEF COMPLAINT:  Dyspnea, AMS  Brief History   Lucas de La Chapman Moss Sr. is a 79 y.o. male who resides in [place] but is in [place] visiting family.  He is vaccinated for COVID per reports but was unfortunately admitted overnight 12/8 with COVID PNA.  Initially placed on BiPAP but PCCM consulted AM 12/9 for intubation.  History of present illness   Pt is not able to communicate due to BiPAP, language barrier, hard or hearing state; therefore, this HPI is obtained from chart review. Lucas de La Chapman Moss Sr. is a 79 y.o. male who has a PMH including but not limited to CAD, HTN, cardiomyopathy, hard of hearing.  He resides in [place] but is in [place] visiting family.  He has been in [place] for 4 weeks.  He has been vaccinated for COVID x 2 per reports.  He presented to [facility] ED 12/8 with AMS, confusion, dyspnea.  He was apparently seen in ED 12/4 with AMS and was discharged home.  12/8, he fell in bathtub and then son noted him to have worsening confusion, weakness, SOB.  In ED, he was found to by hypoxic to the 50's with tachypnea and confusion.  COVID PCR was positive.  He was started on remdesivir and steroids and was placed on BiPAP.  Initial EKG also showed A.fib with RVR where rates improved after BiPAP.  Initial troponin 4126.  Cards consulted for NSTEMI.  CT head negative.  Past Medical History  has COVID; Acute respiratory failure with hypoxia (HCC); Sepsis (HCC); Altered mental status; Atrial fibrillation with RVR (HCC); NSTEMI (non-ST elevated myocardial infarction) (HCC); and Acute respiratory failure due to COVID-19 [facility]) on their problem list.  Significant Hospital Events   12/9 > admitted overnight.  Intubated AM 12/9.  Consults:  PCCM.  Procedures:  ETT 12/9 >   Significant Diagnostic Tests:  CT head 12/  9 > bilateral ethmoid sinus disease. Echo 12/9 >   Micro Data:  COVID 12/8 > POS. Flu 12/8 > neg. Blood 12/8 >   Antimicrobials:  Azithro 12/8 >  Ceftriaxone 12/8 >  Remdesivir 12/8 >    Interim history/subjective:  Sedated and intubated  Objective:  Blood pressure 112/73, pulse 68, temperature 98.6 F (37 C), temperature source Oral, resp. rate (!) 34, height 5\' 2"  (1.575 m), weight 82.2 kg, SpO2 93 %. CVP:  [4 mmHg-18 mmHg] 4 mmHg  Vent Mode: PRVC FiO2 (%):  [80 %-100 %] 80 % Set Rate:  [34 bmp] 34 bmp Vt Set:  [400 mL] 400 mL PEEP:  [14 cmH20] 14 cmH20 Plateau Pressure:  [26 cmH20-28 cmH20] 26 cmH20   Intake/Output Summary (Last 24 hours) at 02/04/2020 1117 Last data filed at 02/04/2020 1100 Gross per 24 hour  Intake 3413.27 ml  Output 1950 ml  Net 1463.27 ml   Filed Weights   02/03/20 0503 02/03/20 2100  Weight: 90.7 kg 82.2 kg   Physical Exam: General: Critically ill-appearing, sedated HENT: [place], AT, ETT in place Eyes: EOMI, no scleral icterus Respiratory: Clear to auscultation bilaterally.  No crackles, wheezing or rales Cardiovascular: RRR, -M/R/G, no JVD GI: BS+, soft, nontender Extremities:-Edema,-tenderness Neuro: Sedated, withdrawal to pain x 4  Assessment & Plan:  Acute hypoxic respiratory failure w/ COVID PNA/ARDS +/- pulmonary edema Plan ARDS protocol w/ low VT ventilation PAD protocol  for goal RASS -4. Transition from precedex to propofol Day 3 Solumedrol  Day 3 Rocephin and azith  Baricitinib VT day 1 of 14  LE dopplers to rule out DVT  Septic shock secondary to COVID, sedation-related -s/p 2 liters of crystalloid. Requiring low-dose pressor support Plan Continue antibiotics for co-comitant bacterial pneumonia Continue management as above for covid Wean vasopressor support for MAP goal >65 Trend LA  NSTEMI in setting of CAD w/ presumed h/o heart failure; looks like demand ischemia  Has h/o pace maker -Peak troponin 4.1K Plan IV  heparin Tele  ASA  AF w/ RVR Plan Cont telemetry  Continue heparin Holding bb  AKI  - improving Fluid and electrolyte imbalance: hyponatremia, hypokalemia, anion gap metabolic acidosis Plan Monitor UOP/Cr  Best Practice:  Diet: TF Pain/Anxiety/Delirium protocol (if indicated): PAD protocol VAP protocol (if indicated): 12/9 DVT prophylaxis: SCD's / Heparin. GI prophylaxis: PPI Glucose control: ssi Mobility: BR Last date of multidisciplinary goals of care discussion: goals of care discussed w/ son. Dr Katrinka Blazing  Family and staff present: no family present  Summary of discussion: intubate/support but DNR if arrests  Follow up goals of care discussion due:12/16 Code Status: DNR in setting of cardiac arrest Family Communication: son Lucas Clay. Will update family with interpretor today Disposition:  ICU  Labs   CBC: Recent Labs  Lab 02/17/2020 2325 01/29/2020 2347 02/03/20 0805 02/03/20 1220 02/03/20 1454 02/04/20 0451 02/04/20 0826  WBC 17.6*  --  13.1*  --   --  13.7*  --   NEUTROABS 15.6*  --   --   --   --  12.8*  --   HGB 14.1   < > 12.8* 13.6 13.6 13.3 12.2*  HCT 43.0   < > 39.2 40.0 40.0 40.7 36.0*  MCV 90.0  --  89.3  --   --  90.4  --   PLT 263  --  202  --   --  237  --    < > = values in this interval not displayed.   Basic Metabolic Panel: Recent Labs  Lab 01/27/2020 2325 02/14/2020 2347 02/03/20 0805 02/03/20 1220 02/03/20 1454 02/04/20 0451 02/04/20 0826 02/04/20 0836  NA 128*   < > 133* 133* 132* 134* 135  --   K 3.3*   < > 3.1* 3.4* 3.9 3.8 3.9  --   CL 93*  --  97*  --   --  100  --   --   CO2 19*  --  20*  --   --  20*  --   --   GLUCOSE 129*  --  128*  --   --  190*  --   --   BUN 66*  --  77*  --   --  78*  --   --   CREATININE 2.58*  --  2.52*  --   --  2.10*  --   --   CALCIUM 8.2*  --  7.9*  --   --  7.8*  --   --   MG  --   --   --   --   --   --   --  2.1  PHOS  --   --   --   --   --   --   --  5.7*   < > = values in this interval not  displayed.   GFR: Estimated Creatinine Clearance: 25.6 mL/min (A) (by C-G formula based on  SCr of 2.1 mg/dL (H)). Recent Labs  Lab 02/21/2020 2324 01/26/2020 2325 02/03/20 0402 02/03/20 0507 02/03/20 0805 02/04/20 0451  PROCALCITON  --  2.49  --  2.75 2.35 1.70  WBC  --  17.6*  --   --  13.1* 13.7*  LATICACIDVEN 2.6*  --  1.7  --   --   --    Liver Function Tests: Recent Labs  Lab 02/06/2020 2325 02/03/20 0805 02/04/20 0451  AST 139* 117* 57*  ALT 42 40 32  ALKPHOS 51 47 50  BILITOT 1.7* 1.1 0.9  PROT 7.2 6.1* 5.8*  ALBUMIN 2.9* 2.4* 2.0*   No results for input(s): LIPASE, AMYLASE in the last 168 hours. Recent Labs  Lab 02/09/2020 2325  AMMONIA 20   ABG    Component Value Date/Time   PHART 7.365 02/04/2020 0826   PCO2ART 36.5 02/04/2020 0826   PO2ART 63 (L) 02/04/2020 0826   HCO3 20.9 02/04/2020 0826   TCO2 22 02/04/2020 0826   ACIDBASEDEF 4.0 (H) 02/04/2020 0826   O2SAT 91.0 02/04/2020 0826    Coagulation Profile: Recent Labs  Lab 01/31/2020 2325  INR 1.1   Cardiac Enzymes: No results for input(s): CKTOTAL, CKMB, CKMBINDEX, TROPONINI in the last 168 hours. HbA1C: Hgb A1c MFr Bld  Date/Time Value Ref Range Status  02/04/2020 08:35 AM 6.8 (H) 4.8 - 5.6 % Final    Comment:    (NOTE) Pre diabetes:          5.7%-6.4%  Diabetes:              >6.4%  Glycemic control for   <7.0% adults with diabetes    CBG: Recent Labs  Lab 02/03/20 0016 02/03/20 2043 02/03/20 2308 02/04/20 0307 02/04/20 0758  GLUCAP 112* 162* 156* 167* 183*    Review of Systems:   Not able   Past medical history  He,  has a past medical history of Hypertension, Ischemic cardiomyopathy, and Pacemaker.   Surgical History    Past Surgical History:  Procedure Laterality Date  . CARDIAC CATHETERIZATION       Social History   reports that he has never smoked. He has never used smokeless tobacco. He reports that he does not drink alcohol and does not use drugs.   Family history    His family history is not on file.   Allergies No Known Allergies   Home meds  Prior to Admission medications   Not on File    Critical care time: 34 min   Simonne Martinet ACNP-BC [facility] Pulmonary/Critical Care Pager # [phone] OR # [phone] if no answer

## 2020-02-04 NOTE — Progress Notes (Addendum)
Initial Nutrition Assessment  DOCUMENTATION CODES:   Not applicable  INTERVENTION:   Initiate tube feeding via OG tube: Vital AF 1.2 at 50 ml/h (1200 ml per day) Prosource TF 45 ml TID  Provides 1560 kcal (1621 kcal total with propofol), 123 gm protein, 973 ml free water daily  Free water flushes 175 ml every 6 hours for total of 1673 ml free water daily.  NUTRITION DIAGNOSIS:   Increased nutrient needs related to acute illness (COVID ARDS) as evidenced by estimated needs.  GOAL:   Patient will meet greater than or equal to 90% of their needs  MONITOR:   Vent status,TF tolerance,Labs  REASON FOR ASSESSMENT:   Ventilator,Consult Enteral/tube feeding initiation and management  ASSESSMENT:   79 yo male admitted with respiratory failure, COVID ARDS, NSTEMI, pulmonary edema. Patient is visiting his son from [place]. PMH includes ischemic cardiomyopathy, HTN, pacemaker.   Noted guarded prognosis and plans for DNR in event of cardiac arrest. Patient is currently not a candidate for cardiac procedures due to critical illness per Cardiology. Received MD Consult for TF initiation and management. OG tube in place.  Patient is currently intubated on ventilator support MV: 12.6 L/min Temp (24hrs), Avg:97.9 F (36.6 C), Min:97.5 F (36.4 C), Max:98.6 F (37 C)  Propofol: 2.3 ml/hr providing 61 kcal from lipid  Labs reviewed. Phos 5.7, A1C 6.8 CBG: 167-183  Medications reviewed and include baricitinib, Colace, novolog, solumedrol, Miralax, levophed, propofol.  No recent weights available PTA.  I/O +2 L since admission UOP 1437 ml x 24 hours  Diet Order:   Diet Order            Diet NPO time specified  Diet effective now                 EDUCATION NEEDS:   Not appropriate for education at this time  Skin:  Skin Assessment: Reviewed RN Assessment  Last BM:  PTA  Height:   Ht Readings from Last 1 Encounters:  02/03/20 5\' 2"  (1.575 m)    Weight:   Wt  Readings from Last 1 Encounters:  02/03/20 82.2 kg    BMI:  Body mass index is 33.15 kg/m.  Estimated Nutritional Needs:   Kcal:  14/09/21  Protein:  110-125 gm  Fluid:  >/= 2 L    [id number], RD, LDN, CNSC Please refer to Amion for contact information.

## 2020-02-04 NOTE — Progress Notes (Signed)
ANTICOAGULATION CONSULT NOTE - Follow Up Consult  Pharmacy Consult for heparin Indication: chest pain/ACS  No Known Allergies  Patient Measurements: Height: 5\' 2"  (157.5 cm) Weight: 82.2 kg (181 lb 3.5 oz) IBW/kg (Calculated) : 54.6 Heparin Dosing Weight: 90.7 kg  Vital Signs: Temp: 98.6 F (37 C) (12/10 0755) Temp Source: Oral (12/10 0755) BP: 94/67 (12/10 0730) Pulse Rate: 68 (12/10 0755)  Labs: Recent Labs    19-Feb-2020 2325 2020/02/19 2335 02/19/2020 2347 02/03/20 0507 02/03/20 0805 02/03/20 1220 02/03/20 1333 02/03/20 1334 02/03/20 1454 02/04/20 0451  HGB 14.1  --    < >  --  12.8* 13.6  --   --  13.6 13.3  HCT 43.0  --    < >  --  39.2 40.0  --   --  40.0 40.7  PLT 263  --   --   --  202  --   --   --   --  237  APTT  --  34  --   --   --   --   --   --   --  68*  LABPROT 14.1  --   --   --   --   --   --   --   --   --   INR 1.1  --   --   --   --   --   --   --   --   --   HEPARINUNFRC  --   --   --   --   --   --   --  0.31  --   --   CREATININE 2.58*  --   --   --  2.52*  --   --   --   --  2.10*  TROPONINIHS 1,439*  --    < > 4,188* 3,879*  --  1,978*  --   --   --    < > = values in this interval not displayed.    Estimated Creatinine Clearance: 25.6 mL/min (A) (by C-G formula based on SCr of 2.1 mg/dL (H)).  Assessment: 79 yo man on IV heparin for ACS.  aptt within goal this am - unsure why hep lvl not ordered but can follow aptt this am and resume hep lvl in am  Cbc stable  Goal of Therapy:  Heparin level 0.3-0.7 units/ml Monitor platelets by anticoagulation protocol: Yes   Plan:  Continue IV heparin at 1000 units/hr  Daily HL and CBC while on heparin Monitor for bleeding complications  94, PharmD, BCPS, BCCCP Clinical Pharmacist [phone]  Please check AMION for all [facility] Pharmacy numbers  02/04/2020 8:36 AM

## 2020-02-04 NOTE — Plan of Care (Signed)
  Problem: Education: Goal: Knowledge of risk factors and measures for prevention of condition will improve Outcome: Progressing   

## 2020-02-04 NOTE — Progress Notes (Signed)
Notified E-link of Ferritin level 870. Rn will continue to monitor closely.

## 2020-02-05 ENCOUNTER — Inpatient Hospital Stay (HOSPITAL_COMMUNITY): Payer: HRSA Program

## 2020-02-05 ENCOUNTER — Other Ambulatory Visit: Payer: Self-pay

## 2020-02-05 DIAGNOSIS — U071 COVID-19: Secondary | ICD-10-CM | POA: Diagnosis not present

## 2020-02-05 DIAGNOSIS — R4182 Altered mental status, unspecified: Secondary | ICD-10-CM | POA: Diagnosis not present

## 2020-02-05 LAB — COMPREHENSIVE METABOLIC PANEL
ALT: 26 U/L (ref 0–44)
AST: 27 U/L (ref 15–41)
Albumin: 2.1 g/dL — ABNORMAL LOW (ref 3.5–5.0)
Alkaline Phosphatase: 46 U/L (ref 38–126)
Anion gap: 15 (ref 5–15)
BUN: 113 mg/dL — ABNORMAL HIGH (ref 8–23)
CO2: 18 mmol/L — ABNORMAL LOW (ref 22–32)
Calcium: 7.7 mg/dL — ABNORMAL LOW (ref 8.9–10.3)
Chloride: 100 mmol/L (ref 98–111)
Creatinine, Ser: 2.63 mg/dL — ABNORMAL HIGH (ref 0.61–1.24)
GFR, Estimated: 24 mL/min — ABNORMAL LOW (ref >60)
Glucose, Bld: 205 mg/dL — ABNORMAL HIGH (ref 70–99)
Potassium: 3.4 mmol/L — ABNORMAL LOW (ref 3.5–5.1)
Sodium: 133 mmol/L — ABNORMAL LOW (ref 135–145)
Total Bilirubin: 0.6 mg/dL (ref 0.3–1.2)
Total Protein: 5.6 g/dL — ABNORMAL LOW (ref 6.5–8.1)

## 2020-02-05 LAB — FERRITIN: Ferritin: 561 ng/mL — ABNORMAL HIGH (ref 24–336)

## 2020-02-05 LAB — MAGNESIUM
Magnesium: 2.5 mg/dL — ABNORMAL HIGH (ref 1.7–2.4)
Magnesium: 2.6 mg/dL — ABNORMAL HIGH (ref 1.7–2.4)

## 2020-02-05 LAB — CBC WITH DIFFERENTIAL/PLATELET
Abs Immature Granulocytes: 0.21 10*3/uL — ABNORMAL HIGH (ref 0.00–0.07)
Basophils Absolute: 0 10*3/uL (ref 0.0–0.1)
Basophils Relative: 0 %
Eosinophils Absolute: 0 10*3/uL (ref 0.0–0.5)
Eosinophils Relative: 0 %
HCT: 41.8 % (ref 39.0–52.0)
Hemoglobin: 13.2 g/dL (ref 13.0–17.0)
Immature Granulocytes: 1 %
Lymphocytes Relative: 4 %
Lymphs Abs: 0.8 10*3/uL (ref 0.7–4.0)
MCH: 29.4 pg (ref 26.0–34.0)
MCHC: 31.6 g/dL (ref 30.0–36.0)
MCV: 93.1 fL (ref 80.0–100.0)
Monocytes Absolute: 0.7 10*3/uL (ref 0.1–1.0)
Monocytes Relative: 3 %
Neutro Abs: 19.8 10*3/uL — ABNORMAL HIGH (ref 1.7–7.7)
Neutrophils Relative %: 92 %
Platelets: 416 10*3/uL — ABNORMAL HIGH (ref 150–400)
RBC: 4.49 MIL/uL (ref 4.22–5.81)
RDW: 14.6 % (ref 11.5–15.5)
WBC: 21.5 10*3/uL — ABNORMAL HIGH (ref 4.0–10.5)
nRBC: 0.1 % (ref 0.0–0.2)

## 2020-02-05 LAB — POCT I-STAT 7, (LYTES, BLD GAS, ICA,H+H)
Acid-base deficit: 6 mmol/L — ABNORMAL HIGH (ref 0.0–2.0)
Bicarbonate: 21.8 mmol/L (ref 20.0–28.0)
Calcium, Ion: 1.13 mmol/L — ABNORMAL LOW (ref 1.15–1.40)
HCT: 39 % (ref 39.0–52.0)
Hemoglobin: 13.3 g/dL (ref 13.0–17.0)
O2 Saturation: 88 %
Potassium: 2.9 mmol/L — ABNORMAL LOW (ref 3.5–5.1)
Sodium: 135 mmol/L (ref 135–145)
TCO2: 23 mmol/L (ref 22–32)
pCO2 arterial: 51.4 mmHg — ABNORMAL HIGH (ref 32.0–48.0)
pH, Arterial: 7.236 — ABNORMAL LOW (ref 7.350–7.450)
pO2, Arterial: 64 mmHg — ABNORMAL LOW (ref 83.0–108.0)

## 2020-02-05 LAB — GLUCOSE, CAPILLARY
Glucose-Capillary: 176 mg/dL — ABNORMAL HIGH (ref 70–99)
Glucose-Capillary: 232 mg/dL — ABNORMAL HIGH (ref 70–99)
Glucose-Capillary: 205 mg/dL — ABNORMAL HIGH (ref 70–99)
Glucose-Capillary: 196 mg/dL — ABNORMAL HIGH (ref 70–99)
Glucose-Capillary: 210 mg/dL — ABNORMAL HIGH (ref 70–99)
Glucose-Capillary: 176 mg/dL — ABNORMAL HIGH (ref 70–99)

## 2020-02-05 LAB — BASIC METABOLIC PANEL
Anion gap: 13 (ref 5–15)
BUN: 116 mg/dL — ABNORMAL HIGH (ref 8–23)
CO2: 21 mmol/L — ABNORMAL LOW (ref 22–32)
Calcium: 7.9 mg/dL — ABNORMAL LOW (ref 8.9–10.3)
Chloride: 99 mmol/L (ref 98–111)
Creatinine, Ser: 2.34 mg/dL — ABNORMAL HIGH (ref 0.61–1.24)
GFR, Estimated: 27 mL/min — ABNORMAL LOW (ref >60)
Glucose, Bld: 222 mg/dL — ABNORMAL HIGH (ref 70–99)
Potassium: 2.7 mmol/L — CL (ref 3.5–5.1)
Sodium: 133 mmol/L — ABNORMAL LOW (ref 135–145)

## 2020-02-05 LAB — C-REACTIVE PROTEIN: CRP: 15.4 mg/dL — ABNORMAL HIGH (ref <1.0)

## 2020-02-05 LAB — CULTURE, BLOOD (ROUTINE X 2): Special Requests: ADEQUATE

## 2020-02-05 LAB — HEPARIN LEVEL (UNFRACTIONATED): Heparin Unfractionated: 0.42 IU/mL (ref 0.30–0.70)

## 2020-02-05 LAB — PHOSPHORUS
Phosphorus: 5.8 mg/dL — ABNORMAL HIGH (ref 2.5–4.6)
Phosphorus: 5.7 mg/dL — ABNORMAL HIGH (ref 2.5–4.6)

## 2020-02-05 LAB — D-DIMER, QUANTITATIVE: D-Dimer, Quant: 1.81 ug/mL-FEU — ABNORMAL HIGH (ref 0.00–0.50)

## 2020-02-05 LAB — PROCALCITONIN: Procalcitonin: 1.65 ng/mL

## 2020-02-05 MED ORDER — MIDAZOLAM HCL 2 MG/2ML IJ SOLN: 4.0000 mg | Freq: Once | INTRAMUSCULAR | Status: AC

## 2020-02-05 MED ORDER — POTASSIUM CHLORIDE 10 MEQ/50ML IV SOLN
10.0000 meq | INTRAVENOUS | Status: AC
  Administered 2020-02-05 (×4): 10 meq via INTRAVENOUS
  Filled 2020-02-05 (×4): qty 50

## 2020-02-05 MED ORDER — POTASSIUM CHLORIDE CRYS ER 20 MEQ PO TBCR
40.0000 meq | EXTENDED_RELEASE_TABLET | Freq: Once | ORAL | Status: AC
  Administered 2020-02-05: 18:00:00 40 meq via ORAL
  Filled 2020-02-05: qty 2

## 2020-02-05 MED ORDER — LACTATED RINGERS IV BOLUS
500.0000 mL | Freq: Once | INTRAVENOUS | Status: AC
  Administered 2020-02-05: 20:00:00 500 mL via INTRAVENOUS

## 2020-02-05 MED ORDER — POTASSIUM CHLORIDE 20 MEQ PO PACK
40.0000 meq | PACK | Freq: Once | ORAL | Status: AC
  Administered 2020-02-05: 40 meq
  Filled 2020-02-05: qty 2

## 2020-02-05 MED ORDER — MIDAZOLAM HCL 2 MG/2ML IJ SOLN
INTRAMUSCULAR | Status: AC
  Administered 2020-02-05: 10:00:00 4 mg via INTRAVENOUS
  Filled 2020-02-05: qty 4

## 2020-02-05 MED ORDER — EPINEPHRINE PF 1 MG/ML IJ SOLN
INTRAMUSCULAR | Status: AC
  Filled 2020-02-05: qty 1

## 2020-02-05 MED ORDER — EPINEPHRINE 1 MG/10ML IJ SOSY
PREFILLED_SYRINGE | INTRAMUSCULAR | Status: AC
  Filled 2020-02-05: qty 10

## 2020-02-05 MED ORDER — LEVETIRACETAM 500 MG PO TABS
500.0000 mg | ORAL_TABLET | Freq: Two times a day (BID) | ORAL | Status: DC
  Administered 2020-02-06 (×2): 500 mg via ORAL
  Filled 2020-02-05 (×2): qty 1

## 2020-02-05 MED ORDER — LEVETIRACETAM IN NACL 1000 MG/100ML IV SOLN
1000.0000 mg | Freq: Once | INTRAVENOUS | Status: AC
  Administered 2020-02-05: 20:00:00 1000 mg via INTRAVENOUS
  Filled 2020-02-05: qty 100

## 2020-02-05 NOTE — Procedures (Signed)
Arterial Catheter Insertion Procedure Note  Lucas Clay  [id number]  1941-02-22  Date:02/05/20  Time:11:37 AM    Provider Performing: Brantley Naser Mechele Collin    Procedure: Insertion of Arterial Line (73532) with US guidance (99242)   Indication(s) Blood pressure monitoring and/or need for frequent ABGs  Consent Unable to obtain consent due to emergent nature of procedure.  Anesthesia None   Time Out Verified patient identification, verified procedure, site/side was marked, verified correct patient position, special equipment/implants available, medications/allergies/relevant history reviewed, required imaging and test results available.   Sterile Technique Maximal sterile technique including full sterile barrier drape, hand hygiene, sterile gown, sterile gloves, mask, hair covering, sterile ultrasound probe cover (if used).  Procedure Description Area of catheter insertion was cleaned with chlorhexidine and draped in sterile fashion. With real-time ultrasound guidance an arterial catheter was placed into the left radial artery.  Appropriate arterial tracings confirmed on monitor.     Complications/Tolerance None; patient tolerated the procedure well.   EBL Minimal   Specimen(s) None

## 2020-02-05 NOTE — Progress Notes (Signed)
Assisted tele visit to patient with family member.  Merinda Victorino Harold, RN  

## 2020-02-05 NOTE — Progress Notes (Signed)
ANTICOAGULATION CONSULT NOTE - Follow Up Consult  Pharmacy Consult for heparin Indication: chest pain/ACS  No Known Allergies  Patient Measurements: Height: 5\' 2"  (157.5 cm) Weight: 86 kg (189 lb 9.5 oz) IBW/kg (Calculated) : 54.6 Heparin Dosing Weight: 90.7 kg  Vital Signs: Temp: 98.4 F (36.9 C) (12/11 0800) Temp Source: Axillary (12/11 0800) BP: 106/58 (12/11 0800) Pulse Rate: 72 (12/11 0800)  Labs: Recent Labs    February 27, 2020 2325 02-27-2020 2335 02/27/20 2347 02/03/20 0507 02/03/20 0805 02/03/20 1220 02/03/20 1333 02/03/20 1334 02/03/20 1454 02/04/20 0451 02/04/20 0826 02/05/20 0426 02/05/20 0427  HGB 14.1  --    < >  --  12.8*   < >  --   --    < > 13.3 12.2* 13.2  --   HCT 43.0  --    < >  --  39.2   < >  --   --    < > 40.7 36.0* 41.8  --   PLT 263  --   --   --  202  --   --   --   --  237  --  416*  --   APTT  --  34  --   --   --   --   --   --   --  68*  --   --   --   LABPROT 14.1  --   --   --   --   --   --   --   --   --   --   --   --   INR 1.1  --   --   --   --   --   --   --   --   --   --   --   --   HEPARINUNFRC  --   --   --   --   --   --   --  0.31  --   --   --   --  0.42  CREATININE 2.58*  --   --   --  2.52*  --   --   --   --  2.10*  --  2.63*  --   TROPONINIHS 1,439*  --    < > 4,188* 3,879*  --  1,978*  --   --   --   --   --   --    < > = values in this interval not displayed.    Estimated Creatinine Clearance: 20.9 mL/min (A) (by C-G formula based on SCr of 2.63 mg/dL (H)).  Assessment: 79 yo man on IV heparin for ACS.  Heparin level within goal this am   Cbc stable  Goal of Therapy:  Heparin level 0.3-0.7 units/ml Monitor platelets by anticoagulation protocol: Yes   Plan:  Continue IV heparin at 1000 units/hr  Daily HL and CBC while on heparin Monitor for bleeding complications  94, PharmD, [facility] Clinical Pharmacist Please see AMION for all Pharmacists' Contact Phone Numbers 02/05/2020, 8:46 AM

## 2020-02-05 NOTE — Consult Note (Addendum)
NAME:  Lucas Clay., MRN:  [id number], DOB:  [birth year], LOS: 2 ADMISSION DATE:  01/30/2020, CONSULTATION DATE:  02/03/20 REFERRING MD:  Jerral Ralph  CHIEF COMPLAINT:  Dyspnea, AMS  Brief History   Lucas de La Chapman Moss Sr. is a 79 y.o. male who resides in [place] but is in [place] visiting family.  He is vaccinated for COVID per reports but was unfortunately admitted overnight 12/8 with COVID PNA.  Initially placed on BiPAP but PCCM consulted AM 12/9 for intubation.  History of present illness   Pt is not able to communicate due to BiPAP, language barrier, hard or hearing state; therefore, this HPI is obtained from chart review. Lucas de La Chapman Moss Sr. is a 79 y.o. male who has a PMH including but not limited to CAD, HTN, cardiomyopathy, hard of hearing.  He resides in [place] but is in [place] visiting family.  He has been in [place] for 4 weeks.  He has been vaccinated for COVID x 2 per reports.  He presented to [facility] ED 12/8 with AMS, confusion, dyspnea.  He was apparently seen in ED 12/4 with AMS and was discharged home.  12/8, he fell in bathtub and then son noted him to have worsening confusion, weakness, SOB.  In ED, he was found to by hypoxic to the 50's with tachypnea and confusion.  COVID PCR was positive.  He was started on remdesivir and steroids and was placed on BiPAP.  Initial EKG also showed A.fib with RVR where rates improved after BiPAP.  Initial troponin 4126.  Cards consulted for NSTEMI.  CT head negative.  Past Medical History  has COVID; Acute respiratory failure with hypoxia (HCC); Sepsis (HCC); Altered mental status; Atrial fibrillation with RVR (HCC); NSTEMI (non-ST elevated myocardial infarction) (HCC); and Acute respiratory failure due to COVID-19 [facility]) on their problem list.  Significant Hospital Events   12/9 > admitted overnight.  Intubated AM 12/9.  Consults:  PCCM.  Procedures:  ETT 12/9 >   Significant Diagnostic Tests:  CT head 12/  9 > bilateral ethmoid sinus disease. Echo 12/9 >   Micro Data:  COVID 12/8 > POS. Flu 12/8 > neg. Blood 12/8 >   Antimicrobials:  Azithro 12/8 >  Ceftriaxone 12/8 >  Remdesivir 12/8 >    Interim history/subjective:  Witnessed seizure at bedside. DC'd heparin and ordered STAT EEG and CT head. Neuro consulted  Objective:  Blood pressure (!) 93/45, pulse 74, temperature 98.5 F (36.9 C), temperature source Axillary, resp. rate (!) 30, height 5\' 2"  (1.575 m), weight 86 kg, SpO2 95 %. CVP:  [1 mmHg-15 mmHg] 15 mmHg  Vent Mode: PRVC FiO2 (%):  [70 %-80 %] 70 % Set Rate:  [34 bmp] 34 bmp Vt Set:  [400 mL] 400 mL PEEP:  [14 cmH20] 14 cmH20 Plateau Pressure:  [26 cmH20-27 cmH20] 26 cmH20   Intake/Output Summary (Last 24 hours) at 02/05/2020 1100 Last data filed at 02/05/2020 1000 Gross per 24 hour  Intake 2942.6 ml  Output 657.5 ml  Net 2285.1 ml   Filed Weights   02/03/20 0503 02/03/20 2100 02/05/20 0444  Weight: 90.7 kg 82.2 kg 86 kg   Physical Exam: General: Critically ill-appearing, sedated HENT: [place], AT, ETT in place Eyes: EOMI, no scleral icterus Respiratory: Diminished breath sounds bilaterally.  No crackles, wheezing or rales Cardiovascular: RRR, -M/R/G, no JVD GI: BS+, soft, nontender Extremities:-Edema,-tenderness Neuro: Sedated  Assessment & Plan:  Acute hypoxic respiratory failure w/ COVID PNA/ARDS +/-  pulmonary edema -LE dopplers negative  Plan ARDS protocol w/ low VT ventilation Reviewed ABG. PO2 55-80 achieved. No vent changes PAD protocol for goal RASS -4 Day 4 Solumedrol  Day 4 Rocephin and azith  Baricitinib VT day 2 of 14   Seizure Plan EEG STAT CT head Neurology consulted  Septic shock secondary to COVID, sedation-related -s/p 2 liters of crystalloid. Requiring low-dose pressor support. LA normal Plan Continue antibiotics for co-comitant bacterial pneumonia Continue management as above for covid  Wean vasopressor support for MAP goal  >65  NSTEMI in setting of CAD w/ presumed h/o heart failure; looks like demand ischemia  Has h/o pace maker -Peak troponin 4.1K Plan ASA  AF w/ RVR Plan Cont telemetry  Continue heparin Holding bb  AKI  - worsening, decreased UOP Fluid and electrolyte imbalance: hyponatremia, hypokalemia, anion gap metabolic acidosis Plan Monitor UOP/Cr Replete K Repeat BMP this pm  Will need to discuss GOC with family via Spanish interpreter  Best Practice:  Diet: TF Pain/Anxiety/Delirium protocol (if indicated): PAD protocol VAP protocol (if indicated): 12/9 DVT prophylaxis: SCD's / Heparin. GI prophylaxis: PPI Glucose control: ssi Mobility: BR Last date of multidisciplinary goals of care discussion: goals of care discussed w/ son. Dr Katrinka Blazing  Family and staff present: no family present  Summary of discussion: intubate/support but DNR if arrests  Follow up goals of care discussion due:12/16 Code Status: DNR in setting of cardiac arrest Family Communication: son Lucas Clay. Will update family with interpretor today Disposition:  ICU  Labs   CBC: Recent Labs  Lab 02/04/2020 2325 02/03/2020 2347 02/03/20 0805 02/03/20 1220 02/03/20 1454 02/04/20 0451 02/04/20 0826 02/05/20 0426 02/05/20 0953  WBC 17.6*  --  13.1*  --   --  13.7*  --  21.5*  --   NEUTROABS 15.6*  --   --   --   --  12.8*  --  19.8*  --   HGB 14.1   < > 12.8*   < > 13.6 13.3 12.2* 13.2 13.3  HCT 43.0   < > 39.2   < > 40.0 40.7 36.0* 41.8 39.0  MCV 90.0  --  89.3  --   --  90.4  --  93.1  --   PLT 263  --  202  --   --  237  --  416*  --    < > = values in this interval not displayed.   Basic Metabolic Panel: Recent Labs  Lab 02/23/2020 2325 02/23/2020 2347 02/03/20 0805 02/03/20 1220 02/03/20 1454 02/04/20 0451 02/04/20 0826 02/04/20 0836 02/04/20 1533 02/05/20 0426 02/05/20 0953  NA 128*   < > 133*   < > 132* 134* 135  --   --  133* 135  K 3.3*   < > 3.1*   < > 3.9 3.8 3.9  --   --  3.4* 2.9*  CL 93*   --  97*  --   --  100  --   --   --  100  --   CO2 19*  --  20*  --   --  20*  --   --   --  18*  --   GLUCOSE 129*  --  128*  --   --  190*  --   --   --  205*  --   BUN 66*  --  77*  --   --  78*  --   --   --  113*  --   CREATININE 2.58*  --  2.52*  --   --  2.10*  --   --   --  2.63*  --   CALCIUM 8.2*  --  7.9*  --   --  7.8*  --   --   --  7.7*  --   MG  --   --   --   --   --   --   --  2.1 2.4 2.5*  --   PHOS  --   --   --   --   --   --   --  5.7* 5.4* 5.8*  --    < > = values in this interval not displayed.   GFR: Estimated Creatinine Clearance: 20.9 mL/min (A) (by C-G formula based on SCr of 2.63 mg/dL (H)). Recent Labs  Lab 02/18/2020 2324 01/26/2020 2325 02/22/2020 2325 02/03/20 0402 02/03/20 0507 02/03/20 0805 02/04/20 0451 02/04/20 1533 02/04/20 2111 02/05/20 0426  PROCALCITON  --  2.49   < >  --  2.75 2.35 1.70  --   --  1.65  WBC  --  17.6*  --   --   --  13.1* 13.7*  --   --  21.5*  LATICACIDVEN 2.6*  --   --  1.7  --   --   --  1.8 1.5  --    < > = values in this interval not displayed.   Liver Function Tests: Recent Labs  Lab 02/19/2020 2325 02/03/20 0805 02/04/20 0451 02/05/20 0426  AST 139* 117* 57* 27  ALT 42 40 32 26  ALKPHOS 51 47 50 46  BILITOT 1.7* 1.1 0.9 0.6  PROT 7.2 6.1* 5.8* 5.6*  ALBUMIN 2.9* 2.4* 2.0* 2.1*   No results for input(s): LIPASE, AMYLASE in the last 168 hours. Recent Labs  Lab 02/04/2020 2325  AMMONIA 20   ABG    Component Value Date/Time   PHART 7.236 (L) 02/05/2020 0953   PCO2ART 51.4 (H) 02/05/2020 0953   PO2ART 64 (L) 02/05/2020 0953   HCO3 21.8 02/05/2020 0953   TCO2 23 02/05/2020 0953   ACIDBASEDEF 6.0 (H) 02/05/2020 0953   O2SAT 88.0 02/05/2020 0953    Coagulation Profile: Recent Labs  Lab 02/07/2020 2325  INR 1.1   Cardiac Enzymes: No results for input(s): CKTOTAL, CKMB, CKMBINDEX, TROPONINI in the last 168 hours. HbA1C: Hgb A1c MFr Bld  Date/Time Value Ref Range Status  02/04/2020 08:35 AM 6.8 (H) 4.8 -  5.6 % Final    Comment:    (NOTE) Pre diabetes:          5.7%-6.4%  Diabetes:              >6.4%  Glycemic control for   <7.0% adults with diabetes    CBG: Recent Labs  Lab 02/04/20 1513 02/04/20 1941 02/04/20 2337 02/05/20 0402 02/05/20 0811  GLUCAP 191* 197* 181* 176* 232*    The patient is critically ill with multiple organ systems failure and requires high complexity decision making for assessment and support, frequent evaluation and titration of therapies, application of advanced monitoring technologies and extensive interpretation of multiple databases.  Independent Critical Care Time: 40 Minutes.   Mechele Collin, M.D. [facility] Pulmonary/Critical Care Medicine 02/05/2020 11:00 AM   Please see Amion for pager number to reach on-call Pulmonary and Critical Care Team.

## 2020-02-05 NOTE — Significant Event (Signed)
PCCM Interval Note  MD at bedside when patient seen with left sided upper extremity jerking movement that progressed to bilateral upper extremity movement associated with left sided gaze. Episode lasted <1 minute and resolved. Continued to have right sided gaze. Patient given versed and STAT EEG ordered.  Mechele Collin, M.D. [facility] Pulmonary/Critical Care Medicine 02/05/2020 9:42 AM

## 2020-02-05 NOTE — Significant Event (Signed)
PCCM Interval Note  CT head demonstrated small left-sided subdural hematoma, likely present on admission.  Plan Discontinue heparin gtt Will need repeat CT head in 24-48 hours in ensure stability  Mechele Collin, M.D. [facility] Pulmonary/Critical Care Medicine 02/05/2020 3:01 PM

## 2020-02-05 NOTE — Consult Note (Addendum)
NEUROLOGY CONSULT  Reason for Consult: seizure Referring Physician: Dr  CC: unable; coma  HPI: [place]. is an 79 y.o. male with HTN, ischemic cardiomyopathy and has a pacer who isvisiting from [place] and is admitted for AMS, dyspnea and diagnosed with COVID pna. He was on BiPAP, but on 12/9 declined and was intubated. He developed Afib RVR and NSTEMI during ICU stay. He remains critically ill with multiple organ system failure. Today, medical staff noted generalized seizure lasting ~67min. No known history of seizures. EEG did not show current seizures, but was irregular and slow. On exam, we are unable to hold propofol because pt nearly instantly desats and becomes unstable on vent. He is currently "partial code". Stat [place] today is pending. Prev CTH done on 12/9 reviewed and did not show anything acute.   Past Medical History Past Medical History:  Diagnosis Date  . Hypertension   . Ischemic cardiomyopathy   . Pacemaker    medtronic    Past Surgical History Past Surgical History:  Procedure Laterality Date  . CARDIAC CATHETERIZATION      Family History History reviewed. No pertinent family history.  Social History    reports that he has never smoked. He has never used smokeless tobacco. He reports that he does not drink alcohol and does not use drugs.  Allergies No Known Allergies  Home Medications No medications prior to admission.    Hospital Medications . EPINEPHrine      . EPINEPHrine      . aspirin  81 mg Per Tube Daily  . atorvastatin  20 mg Per Tube Daily  . baricitinib  2 mg Per Tube Daily  . chlorhexidine gluconate (MEDLINE KIT)  15 mL Mouth Rinse BID  . Chlorhexidine Gluconate Cloth  6 each Topical Daily  . docusate  100 mg Per Tube BID  . feeding supplement (PROSource TF)  45 mL Per Tube TID  . fentaNYL (SUBLIMAZE) injection  50 mcg Intravenous Once  . free water  175 mL Per Tube Q6H  . insulin aspart  0-15 Units Subcutaneous Q4H   . mouth rinse  15 mL Mouth Rinse 10 times per day  . [START ON 02/06/2020] predniSONE  50 mg Per Tube Daily   Followed by  . methylPREDNISolone (SOLU-MEDROL) injection  1 mg/kg Intravenous Q12H  . mupirocin ointment  1 application Nasal BID  . polyethylene glycol  17 g Per Tube Daily  . sodium chloride flush  10-40 mL Intracatheter Q12H     ROS: Unable; coma   Physical Examination:  Vitals:   02/05/20 1000 02/05/20 1100 02/05/20 1200 02/05/20 1300  BP:      Pulse: 74 67 73 68  Resp: (!) 30 (!) 34 (!) 30 (!) 34  Temp: 98.5 F (36.9 C)     TempSrc: Axillary     SpO2: 95% 94% 94% 94%  Weight:      Height:        General - critically ill appearing, vented and sedated, obese man Heart - Afib 100s Lungs - Vented, wet cough; diminished lung sounds Abdomen - Soft - non tender Extremities - Distal pulses intact - general mild edema throughout Skin - Warm and dry; dark discoloration in toes in left foot noted; poor perfusion in ext. Currently on pressors  Neurologic Examination:  (Limited d/t inability to hold sedation as he becomes instantly unstable with attempt to pause propofol.) Mental Status:  Does not alert to sternal rub. Gaze is  slightly dysconjugate and forced upward. Right pupil is irreg and appears surgical. Left pupil is very sluggish 38mm. No blink. No OCR. There is a cough/gag noted. No movements seen in ext to noxious stimuli. GCS 3t   LABORATORY STUDIES:  Basic Metabolic Panel: Recent Labs  Lab 01/30/2020 2325 02/10/2020 2347 02/03/20 0805 02/03/20 1220 02/03/20 1454 02/04/20 0451 02/04/20 0826 02/04/20 0836 02/04/20 1533 02/05/20 0426 02/05/20 0953  NA 128*   < > 133*   < > 132* 134* 135  --   --  133* 135  K 3.3*   < > 3.1*   < > 3.9 3.8 3.9  --   --  3.4* 2.9*  CL 93*  --  97*  --   --  100  --   --   --  100  --   CO2 19*  --  20*  --   --  20*  --   --   --  18*  --   GLUCOSE 129*  --  128*  --   --  190*  --   --   --  205*  --   BUN 66*  --   77*  --   --  78*  --   --   --  113*  --   CREATININE 2.58*  --  2.52*  --   --  2.10*  --   --   --  2.63*  --   CALCIUM 8.2*  --  7.9*  --   --  7.8*  --   --   --  7.7*  --   MG  --   --   --   --   --   --   --  2.1 2.4 2.5*  --   PHOS  --   --   --   --   --   --   --  5.7* 5.4* 5.8*  --    < > = values in this interval not displayed.    Liver Function Tests: Recent Labs  Lab 02/19/2020 2325 02/03/20 0805 02/04/20 0451 02/05/20 0426  AST 139* 117* 57* 27  ALT 42 40 32 26  ALKPHOS 51 47 50 46  BILITOT 1.7* 1.1 0.9 0.6  PROT 7.2 6.1* 5.8* 5.6*  ALBUMIN 2.9* 2.4* 2.0* 2.1*   No results for input(s): LIPASE, AMYLASE in the last 168 hours. Recent Labs  Lab 02/09/2020 2325  AMMONIA 20    CBC: Recent Labs  Lab 02/15/2020 2325 02/17/2020 2347 02/03/20 0805 02/03/20 1220 02/03/20 1454 02/04/20 0451 02/04/20 0826 02/05/20 0426 02/05/20 0953  WBC 17.6*  --  13.1*  --   --  13.7*  --  21.5*  --   NEUTROABS 15.6*  --   --   --   --  12.8*  --  19.8*  --   HGB 14.1   < > 12.8*   < > 13.6 13.3 12.2* 13.2 13.3  HCT 43.0   < > 39.2   < > 40.0 40.7 36.0* 41.8 39.0  MCV 90.0  --  89.3  --   --  90.4  --  93.1  --   PLT 263  --  202  --   --  237  --  416*  --    < > = values in this interval not displayed.    Cardiac Enzymes: No results for input(s): CKTOTAL, CKMB, CKMBINDEX, TROPONINI in the last 168 hours.  BNP: Invalid input(s): POCBNP  CBG: Recent Labs  Lab 02/04/20 1941 02/04/20 2337 02/05/20 0402 02/05/20 0811 02/05/20 1201  GLUCAP 197* 181* 176* 232* 205*    Microbiology:   Coagulation Studies: Recent Labs    02/13/2020 2325  LABPROT 14.1  INR 1.1    Urinalysis:  Recent Labs  Lab 02/03/20 0805  COLORURINE AMBER*  LABSPEC 1.018  PHURINE 5.0  GLUCOSEU NEGATIVE  HGBUR MODERATE*  BILIRUBINUR NEGATIVE  KETONESUR NEGATIVE  PROTEINUR 30*  NITRITE NEGATIVE  LEUKOCYTESUR LARGE*    Lipid Panel:     Component Value Date/Time   TRIG 70 02/04/2020  0836    HgbA1C:  Lab Results  Component Value Date   HGBA1C 6.8 (H) 02/04/2020    Urine Drug Screen:      Component Value Date/Time   LABOPIA NONE DETECTED 02/03/2020 0805   COCAINSCRNUR NONE DETECTED 02/03/2020 0805   LABBENZ NONE DETECTED 02/03/2020 0805   AMPHETMU NONE DETECTED 02/03/2020 0805   THCU NONE DETECTED 02/03/2020 0805   LABBARB NONE DETECTED 02/03/2020 0805     Alcohol Level:  Recent Labs  Lab 02/01/2020 2325  ETH <10    Miscellaneous labs:  EKG  EKG  IMAGING: EEG  Result Date: 02/05/2020 Greta Doom, MD     02/05/2020 12:07 PM History: 79 year old male diagnosed with Covid, being evaluated for seizure. Sedation: Midazolam, propofol Technique: This is a 21 channel routine scalp EEG performed at the bedside with bipolar and monopolar montages arranged in accordance to the international 10/20 system of electrode placement. One channel was dedicated to EKG recording. Background: The background consists of low voltage smoothly contoured irregular delta and theta range activities with occasional centrally predominant runs of beta resembling sleep spindles.  No epileptiform activity was seen Photic stimulation: Physiologic driving is now performed EEG Abnormalities: 1) generalized irregular slow activity 2) absent PDR Clinical Interpretation: This EEG is consistent with the patient's sedated state. There was no seizure or seizure predisposition recorded on this study. Please note that lack of epileptiform activity on EEG does not preclude the possibility of epilepsy. Roland Rack, MD [facility] [phone] If 7pm- 7am, please page neurology on call as listed in [place].   VAS [place] LOWER EXTREMITY VENOUS (DVT)  Result Date: 02/04/2020  Lower Venous DVT Study Indications: COVID-19 positive, d-dimer 2.36.  Limitations: Body habitus and poor ultrasound/tissue interface. Comparison Study: No prior study Performing Technologist: Maudry Mayhew MHA, RDMS, RVT, RDCS  Examination Guidelines: A complete evaluation includes B-mode imaging, spectral Doppler, color Doppler, and power Doppler as needed of all accessible portions of each vessel. Bilateral testing is considered an integral part of a complete examination. Limited examinations for reoccurring indications may be performed as noted. The reflux portion of the exam is performed with the patient in reverse Trendelenburg.  +---------+---------------+---------+-----------+----------+--------------+ RIGHT    CompressibilityPhasicitySpontaneityPropertiesThrombus Aging +---------+---------------+---------+-----------+----------+--------------+ CFV      Full           Yes      Yes                                 +---------+---------------+---------+-----------+----------+--------------+ SFJ      Full                                                        +---------+---------------+---------+-----------+----------+--------------+  FV Prox  Full                                                        +---------+---------------+---------+-----------+----------+--------------+ FV Mid   Full                                                        +---------+---------------+---------+-----------+----------+--------------+ FV DistalFull                                                        +---------+---------------+---------+-----------+----------+--------------+ PFV      Full                                                        +---------+---------------+---------+-----------+----------+--------------+ POP      Full           Yes      Yes                                 +---------+---------------+---------+-----------+----------+--------------+ PTV      Full                                                        +---------+---------------+---------+-----------+----------+--------------+ PERO     Full                                                         +---------+---------------+---------+-----------+----------+--------------+   +---------+---------------+---------+-----------+----------+--------------+ LEFT     CompressibilityPhasicitySpontaneityPropertiesThrombus Aging +---------+---------------+---------+-----------+----------+--------------+ CFV      Full           Yes      Yes                                 +---------+---------------+---------+-----------+----------+--------------+ SFJ      Full                                                        +---------+---------------+---------+-----------+----------+--------------+ FV Prox  Full                                                        +---------+---------------+---------+-----------+----------+--------------+   FV Mid   Full                                                        +---------+---------------+---------+-----------+----------+--------------+ FV DistalFull                                                        +---------+---------------+---------+-----------+----------+--------------+ PFV      Full                                                        +---------+---------------+---------+-----------+----------+--------------+ POP      Full           Yes      Yes                                 +---------+---------------+---------+-----------+----------+--------------+ PTV      Full                                                        +---------+---------------+---------+-----------+----------+--------------+ PERO     Full                                                        +---------+---------------+---------+-----------+----------+--------------+   Left Technical Findings: Not visualized segments include limited evaluation left peroneal veins.   Summary: RIGHT: - There is no evidence of deep vein thrombosis in the lower extremity.  - No cystic structure found in the popliteal fossa.  LEFT: - There is  no evidence of deep vein thrombosis in the lower extremity. However, portions of this examination were limited- see technologist comments above.  - No cystic structure found in the popliteal fossa.  *See table(s) above for measurements and observations.    Preliminary    Assessment/Plan: This is a 79yr old man from [place] who was admitted for COVID pna. Neurology consulted for witnessed generalized seizure seen by medical staff. EEG was done  Which did not capture epileptic discharges and was irregular and slow. No ASDs started at this time. Awaiting CTH when pt is stable and able to go with ICU RN.  1. COVID pna with acute hypoxic respiratory failure- care per CCM. Vented, but unstable when sedation held and desats quickly 2. Septic shock- currently on pressors 3. Seizure- while EEG was neg, this doesn't r/o seizures given his critical illness. Will await CTH results.  4. Coma- difficult to tell driving issue as sedation cannot be held d/t respiratory instability. Multiple organ systems failure and shock is contributing.   RECS: CTH pending No ASDs at this time Supportive care in line w/GOC: "  Limited code" currently noted  Attending neurologist's note to follow Desiree Metzger-Cihelka, ARNP-C, ANVP-BC Pager: [phone]  I have seen the patient and reviewed the above note.  He does have reactive pupils bilaterally, though the right is irregular postsurgical appearing.  He has corneals present bilaterally.  EOM are difficult to assess in his current positioning due to lines, etc.  No movement to noxious stimulation.  I would favor continuing Keppra while exam is difficult.  Certainly with his subdural hematoma, he continues to be at risk for seizures.  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  Roland Rack,  MD [facility] [phone]  If 7pm- 7am, please page neurology on call as listed in [place].

## 2020-02-05 NOTE — Progress Notes (Signed)
STAT EEG complete - results pending. ? ?

## 2020-02-05 NOTE — Procedures (Signed)
History: 79 year old male diagnosed with Covid, being evaluated for seizure.  Sedation: Midazolam, propofol  Technique: This is a 21 channel routine scalp EEG performed at the bedside with bipolar and monopolar montages arranged in accordance to the international 10/20 system of electrode placement. One channel was dedicated to EKG recording.    Background: The background consists of low voltage smoothly contoured irregular delta and theta range activities with occasional centrally predominant runs of beta resembling sleep spindles.  No epileptiform activity was seen  Photic stimulation: Physiologic driving is now performed  EEG Abnormalities: 1) generalized irregular slow activity 2) absent PDR  Clinical Interpretation: This EEG is consistent with the patient's sedated state. There was no seizure or seizure predisposition recorded on this study. Please note that lack of epileptiform activity on EEG does not preclude the possibility of epilepsy.   Ritta Slot, MD [facility] [phone]  If 7pm- 7am, please page neurology on call as listed in [place].

## 2020-02-05 NOTE — Progress Notes (Addendum)
PCCM Interval Note  Family discussion held on telephone with patient's son, Ahaan Zobrist. Spanish interpretor via phone was used. I updated him on his father's condition including high O2 requirement on the vent, recent seizure, subdural hematoma and worsening kidney failure. His son queried if dialysis would be an option. I stated that at this time it is not indicated but given his overall condition, I asked if his father would want to pursue additional life support. His son stated yes that aggressive medical measures would be considered however if he was to pass, he would understand that too. I will continue to keep the family updated. Patient's prognosis remains guarded.  Plan Continue DNR status. Intubation ok. No compressions or shocks. Continue medical management. Dialysis ok.  Mechele Collin, M.D. [facility] Pulmonary/Critical Care Medicine 02/05/2020 3:26 PM

## 2020-02-06 DIAGNOSIS — U071 COVID-19: Secondary | ICD-10-CM | POA: Diagnosis not present

## 2020-02-06 DIAGNOSIS — R4182 Altered mental status, unspecified: Secondary | ICD-10-CM

## 2020-02-06 LAB — COMPREHENSIVE METABOLIC PANEL
ALT: 24 U/L (ref 0–44)
AST: 20 U/L (ref 15–41)
Albumin: 2 g/dL — ABNORMAL LOW (ref 3.5–5.0)
Alkaline Phosphatase: 45 U/L (ref 38–126)
Anion gap: 12 (ref 5–15)
BUN: 111 mg/dL — ABNORMAL HIGH (ref 8–23)
CO2: 20 mmol/L — ABNORMAL LOW (ref 22–32)
Calcium: 7.9 mg/dL — ABNORMAL LOW (ref 8.9–10.3)
Chloride: 102 mmol/L (ref 98–111)
Creatinine, Ser: 1.99 mg/dL — ABNORMAL HIGH (ref 0.61–1.24)
GFR, Estimated: 33 mL/min — ABNORMAL LOW (ref >60)
Glucose, Bld: 236 mg/dL — ABNORMAL HIGH (ref 70–99)
Potassium: 3.8 mmol/L (ref 3.5–5.1)
Sodium: 134 mmol/L — ABNORMAL LOW (ref 135–145)
Total Bilirubin: 0.6 mg/dL (ref 0.3–1.2)
Total Protein: 5.4 g/dL — ABNORMAL LOW (ref 6.5–8.1)

## 2020-02-06 LAB — POCT I-STAT 7, (LYTES, BLD GAS, ICA,H+H)
Acid-base deficit: 5 mmol/L — ABNORMAL HIGH (ref 0.0–2.0)
Acid-base deficit: 5 mmol/L — ABNORMAL HIGH (ref 0.0–2.0)
Bicarbonate: 22.3 mmol/L (ref 20.0–28.0)
Bicarbonate: 22.6 mmol/L (ref 20.0–28.0)
Calcium, Ion: 1.16 mmol/L (ref 1.15–1.40)
Calcium, Ion: 1.15 mmol/L (ref 1.15–1.40)
HCT: 36 % — ABNORMAL LOW (ref 39.0–52.0)
HCT: 37 % — ABNORMAL LOW (ref 39.0–52.0)
Hemoglobin: 12.2 g/dL — ABNORMAL LOW (ref 13.0–17.0)
Hemoglobin: 12.6 g/dL — ABNORMAL LOW (ref 13.0–17.0)
O2 Saturation: 82 %
O2 Saturation: 89 %
Patient temperature: 98.5
Potassium: 4 mmol/L (ref 3.5–5.1)
Potassium: 4.2 mmol/L (ref 3.5–5.1)
Sodium: 136 mmol/L (ref 135–145)
Sodium: 135 mmol/L (ref 135–145)
TCO2: 24 mmol/L (ref 22–32)
TCO2: 24 mmol/L (ref 22–32)
pCO2 arterial: 47.6 mmHg (ref 32.0–48.0)
pCO2 arterial: 52.5 mmHg — ABNORMAL HIGH (ref 32.0–48.0)
pH, Arterial: 7.277 — ABNORMAL LOW (ref 7.350–7.450)
pH, Arterial: 7.242 — ABNORMAL LOW (ref 7.350–7.450)
pO2, Arterial: 53 mmHg — ABNORMAL LOW (ref 83.0–108.0)
pO2, Arterial: 67 mmHg — ABNORMAL LOW (ref 83.0–108.0)

## 2020-02-06 LAB — GLUCOSE, CAPILLARY
Glucose-Capillary: 112 mg/dL — ABNORMAL HIGH (ref 70–99)
Glucose-Capillary: 270 mg/dL — ABNORMAL HIGH (ref 70–99)
Glucose-Capillary: 224 mg/dL — ABNORMAL HIGH (ref 70–99)
Glucose-Capillary: 202 mg/dL — ABNORMAL HIGH (ref 70–99)
Glucose-Capillary: 180 mg/dL — ABNORMAL HIGH (ref 70–99)
Glucose-Capillary: 181 mg/dL — ABNORMAL HIGH (ref 70–99)

## 2020-02-06 LAB — CBC WITH DIFFERENTIAL/PLATELET
Abs Immature Granulocytes: 0.28 10*3/uL — ABNORMAL HIGH (ref 0.00–0.07)
Basophils Absolute: 0 10*3/uL (ref 0.0–0.1)
Basophils Relative: 0 %
Eosinophils Absolute: 0 10*3/uL (ref 0.0–0.5)
Eosinophils Relative: 0 %
HCT: 40.3 % (ref 39.0–52.0)
Hemoglobin: 13.3 g/dL (ref 13.0–17.0)
Immature Granulocytes: 1 %
Lymphocytes Relative: 3 %
Lymphs Abs: 0.7 10*3/uL (ref 0.7–4.0)
MCH: 29.4 pg (ref 26.0–34.0)
MCHC: 33 g/dL (ref 30.0–36.0)
MCV: 89.2 fL (ref 80.0–100.0)
Monocytes Absolute: 0.8 10*3/uL (ref 0.1–1.0)
Monocytes Relative: 4 %
Neutro Abs: 20 10*3/uL — ABNORMAL HIGH (ref 1.7–7.7)
Neutrophils Relative %: 92 %
Platelets: 378 10*3/uL (ref 150–400)
RBC: 4.52 MIL/uL (ref 4.22–5.81)
RDW: 14.5 % (ref 11.5–15.5)
WBC: 21.8 10*3/uL — ABNORMAL HIGH (ref 4.0–10.5)
nRBC: 0.1 % (ref 0.0–0.2)

## 2020-02-06 LAB — FERRITIN: Ferritin: 402 ng/mL — ABNORMAL HIGH (ref 24–336)

## 2020-02-06 LAB — D-DIMER, QUANTITATIVE: D-Dimer, Quant: 3.74 ug/mL-FEU — ABNORMAL HIGH (ref 0.00–0.50)

## 2020-02-06 LAB — HEPARIN LEVEL (UNFRACTIONATED): Heparin Unfractionated: <0.1 IU/mL — ABNORMAL LOW (ref 0.30–0.70)

## 2020-02-06 LAB — C-REACTIVE PROTEIN: CRP: 9.7 mg/dL — ABNORMAL HIGH (ref <1.0)

## 2020-02-06 MED ORDER — LACTATED RINGERS IV BOLUS
500.0000 mL | Freq: Once | INTRAVENOUS | Status: AC
  Administered 2020-02-06: 09:00:00 500 mL via INTRAVENOUS

## 2020-02-06 NOTE — Progress Notes (Signed)
Pts head turned to the left side without any complications. ETT tube is secured.

## 2020-02-06 NOTE — Progress Notes (Signed)
Patient currently proned and heavily sedated. Left pupil irregular but reactive, right is obscured by scleral edema. If he survives to the point of weaning sedation, then would consider continuous EEG while weaning, but with negative EEG even during less sedation, suspect low yield at this time.   Will follow peripherally, please call if weaning sedation.    Ritta Slot, MD [facility] [phone]  If 7pm- 7am, please page neurology on call as listed in [place].

## 2020-02-06 NOTE — Progress Notes (Signed)
Pt proned at this time with no complications. Et secured in proper position with cloth tape.

## 2020-02-06 NOTE — Progress Notes (Signed)
PCCM Interval Note  Family discussion held on telephone with patient's son, [place] Hua. Spanish interpretor via phone was used.  I updated him on his father's condition including his persistent hypoxemia despite max vent settings. He is now proned and has improved bedside saturations. We discussed the clinical course of covid and after addressing his questions, we agreed to trial a short term period of proning for 48-72 hours. If his father does not demonstrate significant improvement, he states family would be ok with transitioning to comfort care as he does not wish to prolong his father's suffering.  Mechele Collin, M.D. [facility] Pulmonary/Critical Care Medicine 02/06/2020 5:34 PM

## 2020-02-06 NOTE — Consult Note (Signed)
NAME:  Lucas Cato., MRN:  [id number], DOB:  09/05/1940, LOS: 3 ADMISSION DATE:  2020/02/18, CONSULTATION DATE:  02/03/20 REFERRING MD:  Jerral Ralph  CHIEF COMPLAINT:  Dyspnea, AMS  Brief History   Lucas de La Chapman Moss Sr. is a 79 y.o. male who resides in [place] but is in [place] visiting family.  He is vaccinated for COVID per reports but was unfortunately admitted overnight 12/8 with COVID PNA.  Initially placed on BiPAP but PCCM consulted AM 12/9 for intubation.  History of present illness   Pt is not able to communicate due to BiPAP, language barrier, hard or hearing state; therefore, this HPI is obtained from chart review. Lucas de La Chapman Moss Sr. is a 79 y.o. male who has a PMH including but not limited to CAD, HTN, cardiomyopathy, hard of hearing.  He resides in [place] but is in [place] visiting family.  He has been in [place] for 4 weeks.  He has been vaccinated for COVID x 2 per reports.  He presented to [facility] ED 12/8 with AMS, confusion, dyspnea.  He was apparently seen in ED 12/4 with AMS and was discharged home.  12/8, he fell in bathtub and then son noted him to have worsening confusion, weakness, SOB.  In ED, he was found to by hypoxic to the 50's with tachypnea and confusion.  COVID PCR was positive.  He was started on remdesivir and steroids and was placed on BiPAP.  Initial EKG also showed A.fib with RVR where rates improved after BiPAP.  Initial troponin 4126.  Cards consulted for NSTEMI.  CT head negative.  Past Medical History  has COVID; Acute respiratory failure with hypoxia (HCC); Sepsis (HCC); Altered mental status; Atrial fibrillation with RVR (HCC); NSTEMI (non-ST elevated myocardial infarction) (HCC); and Acute respiratory failure due to COVID-19 [facility]) on their problem list.  Significant Hospital Events   12/9 > admitted overnight.  Intubated AM 12/9. 12/10 Remained stable on high vent requirements 12/11 Discussed with son patient's tenuous  status. Confirms that he would want patient to continue aggressive/standard management for covid pneumonia and would consider dialysis if his renal function worsened. Please see phone note for full details.  Consults:  PCCM.  Procedures:  ETT 12/9 >   Significant Diagnostic Tests:  CT head 12/ 9 > bilateral ethmoid sinus disease. Echo 12/9 > low/normal hypokinesis of inferiorlateral walls  Micro Data:  COVID 12/8 > POS. Flu 12/8 > neg. Blood 12/8 > S. Epidermidis, likely contaminant  Antimicrobials:  Azithro 12/8 >  Ceftriaxone 12/8 >  Remdesivir 12/8 >    Interim history/subjective:  Intubated and sedated. Improved UOP after fluid bolus  Objective:  Blood pressure (!) 98/52, pulse 82, temperature 97.8 F (36.6 C), temperature source Oral, resp. rate (!) 34, height 5\' 2"  (1.575 m), weight 93.2 kg, SpO2 94 %.    Vent Mode: PRVC FiO2 (%):  [70 %-100 %] 100 % Set Rate:  [34 bmp] 34 bmp Vt Set:  [400 mL] 400 mL PEEP:  [14 cmH20] 14 cmH20 Plateau Pressure:  [27 cmH20] 27 cmH20   Intake/Output Summary (Last 24 hours) at 02/06/2020 1018 Last data filed at 02/06/2020 14/01/2020 Gross per 24 hour  Intake 3001.37 ml  Output 1420 ml  Net 1581.37 ml   Filed Weights   02/03/20 2100 02/05/20 0444 02/06/20 0346  Weight: 82.2 kg 86 kg 93.2 kg   Physical Exam: General: Critically ill-appearing, sedated HENT: [place], AT, ETT in place Eyes: EOMI, no  scleral icterus Respiratory: Diminished breath sounds bilaterally.  No crackles, wheezing or rales Cardiovascular: RRR, -M/R/G, no JVD GI: BS+, soft, nontender Extremities:-Edema,-tenderness Neuro: Sedated GU: Foley in place   Assessment & Plan:  Acute hypoxic respiratory failure w/ COVID PNA/ARDS +/- pulmonary edema -LE dopplers negative  Plan ARDS protocol w/ low VT ventilation Reviewed ABG: Increased FIO2 100%. Repeat ABG post-vent change. Will plan to prone PAD protocol for goal RASS -4 Day 5 Solumedrol  Day 5 Rocephin and azith   Baricitinib VT day 3 of 14   Seizure Subdural hematoma Plan Neuro consulted Continue Keppra Hold heparin Plan to repeat CT head in 24 hours if clinically stable  Septic shock secondary to COVID, sedation-related -s/p 2 liters of crystalloid. Requiring low-dose pressor support. LA normal Plan IVF bolus now Continue antibiotics for co-comitant bacterial pneumonia Continue management as above for covid  Wean vasopressor support for MAP goal >65  NSTEMI in setting of CAD w/ presumed h/o heart failure; looks like demand ischemia  Has h/o pace maker -Peak troponin 4.1K. On Plavix at home in [place] Plan ASA Hold heparin and plavix due to SDH  AF w/ RVR Plan Cont telemetry  Hold anticoagulation due to [facility] Holding bb  AKI  - improving Cr and UOP Fluid and electrolyte imbalance: hyponatremia, hypokalemia, anion gap metabolic acidosis Plan Monitor UOP/Cr Trend BMP  Will update family via phone translator today  Best Practice:  Diet: TF Pain/Anxiety/Delirium protocol (if indicated): PAD protocol VAP protocol (if indicated): 12/9 DVT prophylaxis: SCD's / Heparin. GI prophylaxis: PPI Glucose control: ssi Mobility: BR Last date of multidisciplinary goals of care discussion: goals of care discussed w/ son. Dr Katrinka Blazing  Family and staff present: no family present  Summary of discussion: intubate/support but DNR if arrests  Follow up goals of care discussion due:12/16 Code Status: DNR in setting of cardiac arrest Family Communication: son Suheyb Raucci updated on 12/11. Please see phone note Disposition:  ICU  Labs   CBC: Recent Labs  Lab 2020-02-04 2325 February 04, 2020 2347 02/03/20 0805 02/03/20 1220 02/04/20 0451 02/04/20 0826 02/05/20 0426 02/05/20 0953 02/06/20 0246 02/06/20 0801  WBC 17.6*  --  13.1*  --  13.7*  --  21.5*  --  21.8*  --   NEUTROABS 15.6*  --   --   --  12.8*  --  19.8*  --  20.0*  --   HGB 14.1   < > 12.8*   < > 13.3 12.2* 13.2 13.3 13.3 12.2*  HCT  43.0   < > 39.2   < > 40.7 36.0* 41.8 39.0 40.3 36.0*  MCV 90.0  --  89.3  --  90.4  --  93.1  --  89.2  --   PLT 263  --  202  --  237  --  416*  --  378  --    < > = values in this interval not displayed.   Basic Metabolic Panel: Recent Labs  Lab 02/03/20 0805 02/03/20 1220 02/04/20 0451 02/04/20 0826 02/04/20 0836 02/04/20 1533 02/05/20 0426 02/05/20 0953 02/05/20 1633 02/06/20 0246 02/06/20 0801  NA 133*   < > 134*   < >  --   --  133* 135 133* 134* 136  K 3.1*   < > 3.8   < >  --   --  3.4* 2.9* 2.7* 3.8 4.0  CL 97*  --  100  --   --   --  100  --  99  102  --   CO2 20*  --  20*  --   --   --  18*  --  21* 20*  --   GLUCOSE 128*  --  190*  --   --   --  205*  --  222* 236*  --   BUN 77*  --  78*  --   --   --  113*  --  116* 111*  --   CREATININE 2.52*  --  2.10*  --   --   --  2.63*  --  2.34* 1.99*  --   CALCIUM 7.9*  --  7.8*  --   --   --  7.7*  --  7.9* 7.9*  --   MG  --   --   --   --  2.1 2.4 2.5*  --  2.6*  --   --   PHOS  --   --   --   --  5.7* 5.4* 5.8*  --  5.7*  --   --    < > = values in this interval not displayed.   GFR: Estimated Creatinine Clearance: 28.8 mL/min (A) (by C-G formula based on SCr of 1.99 mg/dL (H)). Recent Labs  Lab February 13, 2020 2324 02/13/20 2325 02/03/20 0402 02/03/20 0507 02/03/20 0805 02/04/20 0451 02/04/20 1533 02/04/20 2111 02/05/20 0426 02/06/20 0246  PROCALCITON  --    < >  --  2.75 2.35 1.70  --   --  1.65  --   WBC  --    < >  --   --  13.1* 13.7*  --   --  21.5* 21.8*  LATICACIDVEN 2.6*  --  1.7  --   --   --  1.8 1.5  --   --    < > = values in this interval not displayed.   Liver Function Tests: Recent Labs  Lab February 13, 2020 2325 02/03/20 0805 02/04/20 0451 02/05/20 0426 02/06/20 0246  AST 139* 117* 57* 27 20  ALT 42 40 32 26 24  ALKPHOS 51 47 50 46 45  BILITOT 1.7* 1.1 0.9 0.6 0.6  PROT 7.2 6.1* 5.8* 5.6* 5.4*  ALBUMIN 2.9* 2.4* 2.0* 2.1* 2.0*   No results for input(s): LIPASE, AMYLASE in the last 168  hours. Recent Labs  Lab 2020/02/13 2325  AMMONIA 20   ABG    Component Value Date/Time   PHART 7.277 (L) 02/06/2020 0801   PCO2ART 47.6 02/06/2020 0801   PO2ART 53 (L) 02/06/2020 0801   HCO3 22.3 02/06/2020 0801   TCO2 24 02/06/2020 0801   ACIDBASEDEF 5.0 (H) 02/06/2020 0801   O2SAT 82.0 02/06/2020 0801    Coagulation Profile: Recent Labs  Lab February 13, 2020 2325  INR 1.1   Cardiac Enzymes: No results for input(s): CKTOTAL, CKMB, CKMBINDEX, TROPONINI in the last 168 hours. HbA1C: Hgb A1c MFr Bld  Date/Time Value Ref Range Status  02/04/2020 08:35 AM 6.8 (H) 4.8 - 5.6 % Final    Comment:    (NOTE) Pre diabetes:          5.7%-6.4%  Diabetes:              >6.4%  Glycemic control for   <7.0% adults with diabetes    CBG: Recent Labs  Lab 02/05/20 1558 02/05/20 1940 02/05/20 2305 02/06/20 0339 02/06/20 0801  GLUCAP 196* 210* 176* 112* 270*    The patient is critically ill with multiple organ systems failure and requires high  complexity decision making for assessment and support, frequent evaluation and titration of therapies, application of advanced monitoring technologies and extensive interpretation of multiple databases.  Independent Critical Care Time: 45Minutes.   Mechele CollinJane Meghan Tiemann, M.D. [facility]monary/Critical Care Medicine 02/06/2020 10:18 AM   Please see Amion for pager number to reach on-call Pulmonary and Critical Care Team.

## 2020-02-06 NOTE — Progress Notes (Signed)
Pts head turned to the left side without any complications. ETT tube is secured with cloth tape.

## 2020-02-07 ENCOUNTER — Other Ambulatory Visit: Payer: Self-pay

## 2020-02-07 DIAGNOSIS — J80 Acute respiratory distress syndrome: Secondary | ICD-10-CM | POA: Diagnosis not present

## 2020-02-07 DIAGNOSIS — U071 COVID-19: Secondary | ICD-10-CM | POA: Diagnosis not present

## 2020-02-07 LAB — CBC WITH DIFFERENTIAL/PLATELET
Abs Immature Granulocytes: 0.79 10*3/uL — ABNORMAL HIGH (ref 0.00–0.07)
Basophils Absolute: 0.1 10*3/uL (ref 0.0–0.1)
Basophils Relative: 0 %
Eosinophils Absolute: 0 10*3/uL (ref 0.0–0.5)
Eosinophils Relative: 0 %
HCT: 37.5 % — ABNORMAL LOW (ref 39.0–52.0)
Hemoglobin: 12.6 g/dL — ABNORMAL LOW (ref 13.0–17.0)
Immature Granulocytes: 4 %
Lymphocytes Relative: 3 %
Lymphs Abs: 0.7 10*3/uL (ref 0.7–4.0)
MCH: 30 pg (ref 26.0–34.0)
MCHC: 33.6 g/dL (ref 30.0–36.0)
MCV: 89.3 fL (ref 80.0–100.0)
Monocytes Absolute: 1.4 10*3/uL — ABNORMAL HIGH (ref 0.1–1.0)
Monocytes Relative: 7 %
Neutro Abs: 16.8 10*3/uL — ABNORMAL HIGH (ref 1.7–7.7)
Neutrophils Relative %: 86 %
Platelets: 323 10*3/uL (ref 150–400)
RBC: 4.2 MIL/uL — ABNORMAL LOW (ref 4.22–5.81)
RDW: 14.7 % (ref 11.5–15.5)
WBC: 19.7 10*3/uL — ABNORMAL HIGH (ref 4.0–10.5)
nRBC: 0.4 % — ABNORMAL HIGH (ref 0.0–0.2)

## 2020-02-07 LAB — COMPREHENSIVE METABOLIC PANEL
ALT: 19 U/L (ref 0–44)
AST: 14 U/L — ABNORMAL LOW (ref 15–41)
Albumin: 2 g/dL — ABNORMAL LOW (ref 3.5–5.0)
Alkaline Phosphatase: 51 U/L (ref 38–126)
Anion gap: 9 (ref 5–15)
BUN: 127 mg/dL — ABNORMAL HIGH (ref 8–23)
CO2: 24 mmol/L (ref 22–32)
Calcium: 7.8 mg/dL — ABNORMAL LOW (ref 8.9–10.3)
Chloride: 101 mmol/L (ref 98–111)
Creatinine, Ser: 2.48 mg/dL — ABNORMAL HIGH (ref 0.61–1.24)
GFR, Estimated: 25 mL/min — ABNORMAL LOW (ref >60)
Glucose, Bld: 223 mg/dL — ABNORMAL HIGH (ref 70–99)
Potassium: 4.8 mmol/L (ref 3.5–5.1)
Sodium: 134 mmol/L — ABNORMAL LOW (ref 135–145)
Total Bilirubin: 0.5 mg/dL (ref 0.3–1.2)
Total Protein: 5.5 g/dL — ABNORMAL LOW (ref 6.5–8.1)

## 2020-02-07 LAB — GLUCOSE, CAPILLARY
Glucose-Capillary: 107 mg/dL — ABNORMAL HIGH (ref 70–99)
Glucose-Capillary: 197 mg/dL — ABNORMAL HIGH (ref 70–99)
Glucose-Capillary: 204 mg/dL — ABNORMAL HIGH (ref 70–99)
Glucose-Capillary: 206 mg/dL — ABNORMAL HIGH (ref 70–99)
Glucose-Capillary: 207 mg/dL — ABNORMAL HIGH (ref 70–99)
Glucose-Capillary: 213 mg/dL — ABNORMAL HIGH (ref 70–99)

## 2020-02-07 LAB — POCT I-STAT 7, (LYTES, BLD GAS, ICA,H+H)
Acid-base deficit: 2 mmol/L (ref 0.0–2.0)
Bicarbonate: 27 mmol/L (ref 20.0–28.0)
Calcium, Ion: 1.16 mmol/L (ref 1.15–1.40)
HCT: 37 % — ABNORMAL LOW (ref 39.0–52.0)
Hemoglobin: 12.6 g/dL — ABNORMAL LOW (ref 13.0–17.0)
O2 Saturation: 95 %
Potassium: 5 mmol/L (ref 3.5–5.1)
Sodium: 135 mmol/L (ref 135–145)
TCO2: 29 mmol/L (ref 22–32)
pCO2 arterial: 65.3 mmHg (ref 32.0–48.0)
pH, Arterial: 7.224 — ABNORMAL LOW (ref 7.350–7.450)
pO2, Arterial: 91 mmHg (ref 83.0–108.0)

## 2020-02-07 LAB — D-DIMER, QUANTITATIVE: D-Dimer, Quant: >20 ug/mL-FEU — ABNORMAL HIGH (ref 0.00–0.50)

## 2020-02-07 LAB — TRIGLYCERIDES: Triglycerides: 96 mg/dL (ref <150)

## 2020-02-07 LAB — C-REACTIVE PROTEIN: CRP: 5.7 mg/dL — ABNORMAL HIGH (ref <1.0)

## 2020-02-07 LAB — FERRITIN: Ferritin: 325 ng/mL (ref 24–336)

## 2020-02-07 MED ORDER — POLYETHYLENE GLYCOL 3350 17 G PO PACK: 17.0000 g | PACK | Freq: Every day | ORAL | Status: DC | PRN

## 2020-02-07 MED ORDER — LEVETIRACETAM 100 MG/ML PO SOLN
500.0000 mg | Freq: Two times a day (BID) | ORAL | Status: DC
  Administered 2020-02-07 – 2020-02-12 (×11): 500 mg
  Filled 2020-02-07 (×12): qty 5

## 2020-02-07 MED ORDER — DOCUSATE SODIUM 50 MG/5ML PO LIQD: 100.0000 mg | Freq: Every day | ORAL | Status: DC | PRN

## 2020-02-07 MED ORDER — PANTOPRAZOLE SODIUM 40 MG PO PACK
40.0000 mg | PACK | ORAL | Status: DC
  Administered 2020-02-07 – 2020-02-11 (×5): 40 mg
  Filled 2020-02-07 (×5): qty 20

## 2020-02-07 MED ORDER — BARICITINIB 1 MG PO TABS
1.0000 mg | ORAL_TABLET | Freq: Every day | ORAL | Status: DC
  Administered 2020-02-07 – 2020-02-08 (×2): 1 mg
  Filled 2020-02-07 (×2): qty 1

## 2020-02-07 MED ORDER — PROPOFOL 1000 MG/100ML IV EMUL
5.0000 ug/kg/min | INTRAVENOUS | Status: DC
  Administered 2020-02-07 – 2020-02-08 (×8): 50 ug/kg/min via INTRAVENOUS
  Administered 2020-02-09 (×2): 40 ug/kg/min via INTRAVENOUS
  Administered 2020-02-09: 03:00:00 50 ug/kg/min via INTRAVENOUS
  Administered 2020-02-09: 23:00:00 30 ug/kg/min via INTRAVENOUS
  Administered 2020-02-10: 04:00:00 45 ug/kg/min via INTRAVENOUS
  Administered 2020-02-10 (×2): 35 ug/kg/min via INTRAVENOUS
  Administered 2020-02-10 (×2): 40 ug/kg/min via INTRAVENOUS
  Administered 2020-02-11: 03:00:00 35 ug/kg/min via INTRAVENOUS
  Administered 2020-02-11: 22:00:00 50 ug/kg/min via INTRAVENOUS
  Administered 2020-02-11: 12:00:00 40 ug/kg/min via INTRAVENOUS
  Administered 2020-02-11: 19:00:00 50 ug/kg/min via INTRAVENOUS
  Administered 2020-02-11: 06:00:00 35 ug/kg/min via INTRAVENOUS
  Administered 2020-02-12 (×3): 50 ug/kg/min via INTRAVENOUS
  Filled 2020-02-07 (×13): qty 100
  Filled 2020-02-07: qty 200
  Filled 2020-02-07 (×13): qty 100

## 2020-02-07 NOTE — Progress Notes (Signed)
Pt proned at this time with no complications. Pt stable throughout. Et taped in proper position. Head of bed slightly elevated

## 2020-02-07 NOTE — Progress Notes (Signed)
Assisted tele visit to patient with son.  Lucas Chiasson McEachran, RN  

## 2020-02-07 NOTE — Progress Notes (Signed)
Pt supine at this time without any complications. Removed cloth tape and mepilex, no breakdown noted. ETT tube secured with commercial tube holder at proper position. Pt is tolerating it well at this time.

## 2020-02-07 NOTE — Progress Notes (Signed)
NAME:  Lucas Clay., MRN:  [id number], DOB:  04-Jun-1940, LOS: 4 ADMISSION DATE:  2020/02/24, CONSULTATION DATE:  02/03/20 REFERRING MD:  Jerral Ralph  CHIEF COMPLAINT:  Dyspnea, AMS  Brief History   79 yo male from [place] and visiting family in  presented with change in mental status, dyspnea, hypoxia with SpO2 in 50's.  Received COVID vaccine in [place].  Found to have COVID 19 pneumonia.  Tried on Bipap but failed to improve and developed A fib with RVR.  Required intubation.  Past Medical History  CAD, HTN, Decreased hearing, Cardiomyopathy  Significant Hospital Events   12/09 admitted overnight.  Intubated AM 12/9. 12/10 Remained stable on high vent requirements 12/11 Seizure - neurology consulted  Consults:  Neurology  Procedures:  ETT 12/9 >  Rt IJ CVL 12/9 >>   Significant Diagnostic Tests:   CT head 12/9 > bilateral ethmoid sinus disease.  Echo 12/9 > low/normal hypokinesis of inferiorlateral walls  Doppler legs b/l 12/10 >> no DVT  CT head 12/11 >> small Lt SDH w/o change from 12/9  EEG 12/11 >> generalized irregular slow activity  Micro Data:  COVID 12/8 > Positive Blood 12/8 > S. Epidermidis, likely contaminant MRSA PCR 12/10 >> Positive  Antimicrobials:  Azithro 12/8 > 12/13 Ceftriaxone 12/8 > 12/13 Remdesivir 12/8   Interim history/subjective:  Remains on vent, pressors.  Objective:  Blood pressure (!) 116/53, pulse (!) 114, temperature 98.8 F (37.1 C), temperature source Oral, resp. rate (!) 33, height 5\' 2"  (1.575 m), weight 92.6 kg, SpO2 98 %.    Vent Mode: PRVC FiO2 (%):  [100 %] 100 % Set Rate:  [34 bmp] 34 bmp Vt Set:  [400 mL] 400 mL PEEP:  [14 cmH20] 14 cmH20 Plateau Pressure:  [25 cmH20-28 cmH20] 27 cmH20   Intake/Output Summary (Last 24 hours) at 02/07/2020 0953 Last data filed at 02/07/2020 0600 Gross per 24 hour  Intake 2011.65 ml  Output 500 ml  Net 1511.65 ml   Filed Weights   02/05/20 0444 02/06/20 0346  02/07/20 0232  Weight: 86 kg 93.2 kg 92.6 kg   Physical Exam:  General - sedated Eyes - pupils reactive ENT - ETT in place Cardiac - irregular Chest - b/l rhonchi Abdomen - soft, non tender, decreased bowel sounds Extremities - no cyanosis, clubbing, or edema Skin - no rashes Neuro - RASS -2  Assessment & Plan:   Acute hypoxic respiratory failure with ARDS from COVID 19 pneumonia. - f/u CXR, ABG - goal SpO2 > 88% - goals P plat < 300, driving force < 15 - d/c ABx 12/13 - day 4 of baricitinib - day 5 of prednisone  Septic shock from COVID infection. - wean pressor to keep MAP > 65  Acute metabolic encephalopathy 2nd to sepsis from COVID infection, hypoxia. New onset seizure 12/11. Small Lt SDH. - appreciate input from neurology - continue keppra - RASS goal -1 to -2  A fib - new onset this admission. Elevated troponin from demand ischemia in setting of hypoxia. Hx of CAD, HTN, pacemaker - continue lipitor - hold ASA, plavix, heparin gtt in setting of SDH - hold outpt isosorbide, losartan, HCTZ, metoprolol  AKI from ATN in setting of hypoxia, sepsis. - uncertain what baseline renal function is  Steroid induced hyperglycemia. - SSI   Best Practice:  Diet: TF Pain/Anxiety/Delirium protocol (if indicated): PAD protocol VAP protocol (if indicated): 12/9 DVT prophylaxis: SCD GI prophylaxis: protonix Mobility: BR Last date of multidisciplinary  goals of care discussion: goals of care discussed w/ son. Dr Katrinka Blazing  Family and staff present: no family present  Summary of discussion: intubate/support but DNR if arrests  Follow up goals of care discussion due:12/16 Code Status: DNR in setting of cardiac arrest Family Communication:  Disposition:  ICU  Labs    CMP Latest Ref Rng & Units 02/07/2020 02/07/2020 02/06/2020  Glucose 70 - 99 mg/dL - 716(R) -  BUN 8 - 23 mg/dL - 678(L) -  Creatinine 0.61 - 1.24 mg/dL - 3.81(O) -  Sodium 175 - 145 mmol/L 135 134(L) 135   Potassium 3.5 - 5.1 mmol/L 5.0 4.8 4.2  Chloride 98 - 111 mmol/L - 101 -  CO2 22 - 32 mmol/L - 24 -  Calcium 8.9 - 10.3 mg/dL - 7.8(L) -  Total Protein 6.5 - 8.1 g/dL - 5.5(L) -  Total Bilirubin 0.3 - 1.2 mg/dL - 0.5 -  Alkaline Phos 38 - 126 U/L - 51 -  AST 15 - 41 U/L - 14(L) -  ALT 0 - 44 U/L - 19 -    CBC Latest Ref Rng & Units 02/07/2020 02/07/2020 02/06/2020  WBC 4.0 - 10.5 K/uL - 19.7(H) -  Hemoglobin 13.0 - 17.0 g/dL 12.6(L) 12.6(L) 12.6(L)  Hematocrit 39.0 - 52.0 % 37.0(L) 37.5(L) 37.0(L)  Platelets 150 - 400 K/uL - 323 -    ABG    Component Value Date/Time   PHART 7.224 (L) 02/07/2020 0743   PCO2ART 65.3 (HH) 02/07/2020 0743   PO2ART 91 02/07/2020 0743   HCO3 27.0 02/07/2020 0743   TCO2 29 02/07/2020 0743   ACIDBASEDEF 2.0 02/07/2020 0743   O2SAT 95.0 02/07/2020 0743    CBG (last 3)  Recent Labs    02/06/20 2325 02/07/20 0331 02/07/20 0801  GLUCAP 181* 207* 107*    Critical care time: 41 minutes  Coralyn Helling, MD [place] Pulmonary/Critical Care Pager - [phone] 02/07/2020, 10:12 AM

## 2020-02-07 NOTE — Progress Notes (Signed)
Pts head turned to the right side without any complications. ETT tube secured.

## 2020-02-08 ENCOUNTER — Inpatient Hospital Stay (HOSPITAL_COMMUNITY): Payer: HRSA Program

## 2020-02-08 DIAGNOSIS — U071 COVID-19: Secondary | ICD-10-CM | POA: Diagnosis not present

## 2020-02-08 DIAGNOSIS — J80 Acute respiratory distress syndrome: Secondary | ICD-10-CM | POA: Diagnosis not present

## 2020-02-08 LAB — POCT I-STAT 7, (LYTES, BLD GAS, ICA,H+H)
Acid-base deficit: 7 mmol/L — ABNORMAL HIGH (ref 0.0–2.0)
Bicarbonate: 19 mmol/L — ABNORMAL LOW (ref 20.0–28.0)
Calcium, Ion: 1 mmol/L — ABNORMAL LOW (ref 1.15–1.40)
HCT: 25 % — ABNORMAL LOW (ref 39.0–52.0)
Hemoglobin: 8.5 g/dL — ABNORMAL LOW (ref 13.0–17.0)
O2 Saturation: 100 %
Potassium: 3.5 mmol/L (ref 3.5–5.1)
Sodium: 144 mmol/L (ref 135–145)
TCO2: 20 mmol/L — ABNORMAL LOW (ref 22–32)
pCO2 arterial: 39.9 mmHg (ref 32.0–48.0)
pH, Arterial: 7.286 — ABNORMAL LOW (ref 7.350–7.450)
pO2, Arterial: 226 mmHg — ABNORMAL HIGH (ref 83.0–108.0)

## 2020-02-08 LAB — GLUCOSE, CAPILLARY
Glucose-Capillary: 172 mg/dL — ABNORMAL HIGH (ref 70–99)
Glucose-Capillary: 187 mg/dL — ABNORMAL HIGH (ref 70–99)
Glucose-Capillary: 208 mg/dL — ABNORMAL HIGH (ref 70–99)
Glucose-Capillary: 215 mg/dL — ABNORMAL HIGH (ref 70–99)
Glucose-Capillary: 219 mg/dL — ABNORMAL HIGH (ref 70–99)
Glucose-Capillary: 263 mg/dL — ABNORMAL HIGH (ref 70–99)
Glucose-Capillary: 264 mg/dL — ABNORMAL HIGH (ref 70–99)

## 2020-02-08 LAB — COMPREHENSIVE METABOLIC PANEL
ALT: 16 U/L (ref 0–44)
AST: 18 U/L (ref 15–41)
Albumin: 1.7 g/dL — ABNORMAL LOW (ref 3.5–5.0)
Alkaline Phosphatase: 47 U/L (ref 38–126)
Anion gap: 10 (ref 5–15)
BUN: 121 mg/dL — ABNORMAL HIGH (ref 8–23)
CO2: 22 mmol/L (ref 22–32)
Calcium: 7.8 mg/dL — ABNORMAL LOW (ref 8.9–10.3)
Chloride: 105 mmol/L (ref 98–111)
Creatinine, Ser: 1.83 mg/dL — ABNORMAL HIGH (ref 0.61–1.24)
Glucose, Bld: 209 mg/dL — ABNORMAL HIGH (ref 70–99)
Potassium: 4.9 mmol/L (ref 3.5–5.1)
Sodium: 137 mmol/L (ref 135–145)
Total Bilirubin: 0.5 mg/dL (ref 0.3–1.2)
Total Protein: 4.5 g/dL — ABNORMAL LOW (ref 6.5–8.1)

## 2020-02-08 LAB — CULTURE, BLOOD (ROUTINE X 2)
Culture: NO GROWTH
Special Requests: ADEQUATE

## 2020-02-08 LAB — CBC WITH DIFFERENTIAL/PLATELET
Abs Immature Granulocytes: 0 10*3/uL (ref 0.00–0.07)
Basophils Absolute: 0 10*3/uL (ref 0.0–0.1)
Basophils Relative: 0 %
Eosinophils Absolute: 0 10*3/uL (ref 0.0–0.5)
Eosinophils Relative: 0 %
HCT: 36.8 % — ABNORMAL LOW (ref 39.0–52.0)
Hemoglobin: 11.6 g/dL — ABNORMAL LOW (ref 13.0–17.0)
Lymphocytes Relative: 2 %
Lymphs Abs: 0.4 10*3/uL — ABNORMAL LOW (ref 0.7–4.0)
MCH: 29.2 pg (ref 26.0–34.0)
MCHC: 31.5 g/dL (ref 30.0–36.0)
MCV: 92.7 fL (ref 80.0–100.0)
Monocytes Absolute: 1.3 10*3/uL — ABNORMAL HIGH (ref 0.1–1.0)
Monocytes Relative: 7 %
Neutro Abs: 17.3 10*3/uL — ABNORMAL HIGH (ref 1.7–7.7)
Neutrophils Relative %: 91 %
Platelets: 252 10*3/uL (ref 150–400)
RBC: 3.97 MIL/uL — ABNORMAL LOW (ref 4.22–5.81)
RDW: 15 % (ref 11.5–15.5)
WBC: 19 10*3/uL — ABNORMAL HIGH (ref 4.0–10.5)
nRBC: 0 /100 WBC
nRBC: 0.1 % (ref 0.0–0.2)

## 2020-02-08 LAB — BLOOD GAS, ARTERIAL
Acid-base deficit: 4.3 mmol/L — ABNORMAL HIGH (ref 0.0–2.0)
Bicarbonate: 22 mmol/L (ref 20.0–28.0)
Drawn by: 60057
FIO2: 100
O2 Saturation: 99 %
Patient temperature: 36.2
pCO2 arterial: 51.2 mmHg — ABNORMAL HIGH (ref 32.0–48.0)
pH, Arterial: 7.25 — ABNORMAL LOW (ref 7.350–7.450)
pO2, Arterial: 290 mmHg — ABNORMAL HIGH (ref 83.0–108.0)

## 2020-02-08 MED ORDER — INSULIN DETEMIR 100 UNIT/ML ~~LOC~~ SOLN
10.0000 [IU] | Freq: Every day | SUBCUTANEOUS | Status: DC
  Administered 2020-02-08 – 2020-02-10 (×3): 10 [IU] via SUBCUTANEOUS
  Filled 2020-02-08 (×3): qty 0.1

## 2020-02-08 MED ORDER — POLYETHYLENE GLYCOL 3350 17 G PO PACK: 17.0000 g | PACK | Freq: Every day | ORAL | Status: DC | PRN

## 2020-02-08 NOTE — Progress Notes (Signed)
NAME:  Lucas Clay., MRN:  [id number], DOB:  [birth year], LOS: 5 ADMISSION DATE:  02/01/2020, CONSULTATION DATE:  02/03/20 REFERRING MD:  Jerral Ralph  CHIEF COMPLAINT:  Dyspnea, AMS  Brief History   79 yo male from [place] and visiting family in [place] presented with change in mental status, dyspnea, hypoxia with SpO2 in 50's.  Received COVID vaccine in [place].  Found to have COVID 19 pneumonia.  Tried on Bipap but failed to improve and developed A fib with RVR.  Required intubation.  Past Medical History  CAD, HTN, Decreased hearing, Cardiomyopathy  Significant Hospital Events   12/09 admitted overnight.  Intubated AM 12/9. 12/10 Remained stable on high vent requirements 12/11 Seizure - neurology consulted 12/13 prone position  Consults:  Neurology  Procedures:  ETT 12/9 >  Rt IJ CVL 12/9 >>   Significant Diagnostic Tests:   CT head 12/9 > bilateral ethmoid sinus disease.  Echo 12/9 > low/normal hypokinesis of inferiorlateral walls  Doppler legs b/l 12/10 >> no DVT  CT head 12/11 >> small Lt SDH w/o change from 12/9  EEG 12/11 >> generalized irregular slow activity  Micro Data:  COVID 12/8 > Positive Blood 12/8 > S. Epidermidis, likely contaminant MRSA PCR 12/10 >> Positive  Antimicrobials:  Azithro 12/8 > 12/13 Ceftriaxone 12/8 > 12/13 Remdesivir 12/8   Interim history/subjective:  Prone positioning yesterday.  Objective:  Blood pressure (!) 124/47, pulse 69, temperature (!) 96.5 F (35.8 C), temperature source Axillary, resp. rate (!) 33, height 5\' 2"  (1.575 m), weight 89.9 kg, SpO2 100 %.    Vent Mode: PRVC FiO2 (%):  [100 %] 100 % Set Rate:  [34 bmp] 34 bmp Vt Set:  [400 mL] 400 mL PEEP:  [14 cmH20] 14 cmH20 Plateau Pressure:  [20 cmH20-29 cmH20] 29 cmH20   Intake/Output Summary (Last 24 hours) at 02/08/2020 0908 Last data filed at 02/08/2020 0600 Gross per 24 hour  Intake 1017.86 ml  Output 2200 ml  Net -1182.14 ml   Filed Weights    02/06/20 0346 02/07/20 0232 02/08/20 0500  Weight: 93.2 kg 92.6 kg 89.9 kg   Physical Exam:  General - sedated, prone positioning Eyes - periorbital edema ENT - ETT in place Cardiac - irregular Chest - decreased breath sounds, scattered rhonchi Abdomen - soft, non tender, + bowel sounds Extremities - 1+ edema Skin - no rashes Neuro - RASS -3  Assessment & Plan:   Acute hypoxic/hypercapnic respiratory failure with ARDS from COVID 19 pneumonia. - full vent support per ARDS protocol - f/u CXR, ABG - prone positioning for PaO2:FiO2 < 150 - goal P plat < 30, driving force < 15 - allow for permissive hypercapnia; goal pH > 7.20 - day 5 of baricitinib - day 6 of prednisone  Septic shock from COVID infection. - wean pressors to keep MAP > 65  Acute metabolic encephalopathy 2nd to sepsis from COVID infection, hypoxia. New onset seizure 12/11. Small Lt SDH. - appreciate input from neurology - continue keppra - RASS goal -1 to -2  A fib - new onset this admission. Elevated troponin from demand ischemia in setting of hypoxia. Hx of CAD, HTN, pacemaker - continue lipitor - hold ASA, plavix, heparin gtt in setting of SDH - hold outpt isosorbide, losartan, HCTZ, metoprolol  AKI from ATN in setting of hypoxia, sepsis. - uncertain what baseline renal function is - renal fx improved some on 12/14 - f/u BMET, monitor urine outpt  Steroid induced hyperglycemia. -  SSI - add levemir 12/14  Best Practice:  Diet: tube feeds DVT prophylaxis: SCD GI prophylaxis: protonix Mobility: bed rest Code Status: No CPR, no defibrillation Disposition:  ICU  Labs    CMP Latest Ref Rng & Units 02/08/2020 02/07/2020 02/07/2020  Glucose 70 - 99 mg/dL 194(R) - 740(C)  BUN 8 - 23 mg/dL 144(Y) - 185(U)  Creatinine 0.61 - 1.24 mg/dL 3.14(H) - 7.02(O)  Sodium 135 - 145 mmol/L 137 135 134(L)  Potassium 3.5 - 5.1 mmol/L 4.9 5.0 4.8  Chloride 98 - 111 mmol/L 105 - 101  CO2 22 - 32 mmol/L 22 - 24   Calcium 8.9 - 10.3 mg/dL 7.8(L) - 7.8(L)  Total Protein 6.5 - 8.1 g/dL 3.7(C) - 5.5(L)  Total Bilirubin 0.3 - 1.2 mg/dL 0.5 - 0.5  Alkaline Phos 38 - 126 U/L 47 - 51  AST 15 - 41 U/L 18 - 14(L)  ALT 0 - 44 U/L 16 - 19    CBC Latest Ref Rng & Units 02/08/2020 02/07/2020 02/07/2020  WBC 4.0 - 10.5 K/uL 19.0(H) - 19.7(H)  Hemoglobin 13.0 - 17.0 g/dL 11.6(L) 12.6(L) 12.6(L)  Hematocrit 39.0 - 52.0 % 36.8(L) 37.0(L) 37.5(L)  Platelets 150 - 400 K/uL 252 - 323    ABG    Component Value Date/Time   PHART 7.250 (L) 02/08/2020 0530   PCO2ART 51.2 (H) 02/08/2020 0530   PO2ART 290 (H) 02/08/2020 0530   HCO3 22.0 02/08/2020 0530   TCO2 29 02/07/2020 0743   ACIDBASEDEF 4.3 (H) 02/08/2020 0530   O2SAT 99.0 02/08/2020 0530    CBG (last 3)  Recent Labs    02/07/20 2343 02/08/20 0335 02/08/20 0729  GLUCAP 204* 187* 208*    Critical care time: 34 minutes  Coralyn Helling, MD [place] Pulmonary/Critical Care Pager - [phone] 02/08/2020, 9:08 AM

## 2020-02-09 ENCOUNTER — Inpatient Hospital Stay (HOSPITAL_COMMUNITY): Payer: HRSA Program

## 2020-02-09 DIAGNOSIS — J9600 Acute respiratory failure, unspecified whether with hypoxia or hypercapnia: Secondary | ICD-10-CM | POA: Diagnosis not present

## 2020-02-09 DIAGNOSIS — U071 COVID-19: Secondary | ICD-10-CM | POA: Diagnosis not present

## 2020-02-09 LAB — BLOOD GAS, ARTERIAL
Acid-base deficit: 3.8 mmol/L — ABNORMAL HIGH (ref 0.0–2.0)
Bicarbonate: 21.6 mmol/L (ref 20.0–28.0)
Drawn by: 60057
FIO2: 70
O2 Saturation: 96.6 %
Patient temperature: 37
pCO2 arterial: 45.7 mmHg (ref 32.0–48.0)
pH, Arterial: 7.296 — ABNORMAL LOW (ref 7.350–7.450)
pO2, Arterial: 94.5 mmHg (ref 83.0–108.0)

## 2020-02-09 LAB — BASIC METABOLIC PANEL
Anion gap: 10 (ref 5–15)
BUN: 115 mg/dL — ABNORMAL HIGH (ref 8–23)
CO2: 24 mmol/L (ref 22–32)
Calcium: 8.2 mg/dL — ABNORMAL LOW (ref 8.9–10.3)
Chloride: 104 mmol/L (ref 98–111)
Creatinine, Ser: 1.7 mg/dL — ABNORMAL HIGH (ref 0.61–1.24)
GFR, Estimated: 40 mL/min — ABNORMAL LOW (ref >60)
Glucose, Bld: 147 mg/dL — ABNORMAL HIGH (ref 70–99)
Potassium: 5.1 mmol/L (ref 3.5–5.1)
Sodium: 138 mmol/L (ref 135–145)

## 2020-02-09 LAB — GLUCOSE, CAPILLARY
Glucose-Capillary: 119 mg/dL — ABNORMAL HIGH (ref 70–99)
Glucose-Capillary: 135 mg/dL — ABNORMAL HIGH (ref 70–99)
Glucose-Capillary: 143 mg/dL — ABNORMAL HIGH (ref 70–99)
Glucose-Capillary: 183 mg/dL — ABNORMAL HIGH (ref 70–99)
Glucose-Capillary: 185 mg/dL — ABNORMAL HIGH (ref 70–99)
Glucose-Capillary: 224 mg/dL — ABNORMAL HIGH (ref 70–99)

## 2020-02-09 LAB — CBC
HCT: 35.5 % — ABNORMAL LOW (ref 39.0–52.0)
Hemoglobin: 11.8 g/dL — ABNORMAL LOW (ref 13.0–17.0)
MCH: 30.3 pg (ref 26.0–34.0)
MCHC: 33.2 g/dL (ref 30.0–36.0)
MCV: 91.3 fL (ref 80.0–100.0)
Platelets: 261 10*3/uL (ref 150–400)
RBC: 3.89 MIL/uL — ABNORMAL LOW (ref 4.22–5.81)
RDW: 15 % (ref 11.5–15.5)
WBC: 21.1 10*3/uL — ABNORMAL HIGH (ref 4.0–10.5)
nRBC: 0.2 % (ref 0.0–0.2)

## 2020-02-09 LAB — MAGNESIUM: Magnesium: 2.7 mg/dL — ABNORMAL HIGH (ref 1.7–2.4)

## 2020-02-09 MED ORDER — PREDNISONE 20 MG PO TABS
40.0000 mg | ORAL_TABLET | Freq: Every day | ORAL | Status: DC
  Administered 2020-02-09 – 2020-02-11 (×3): 40 mg
  Filled 2020-02-09 (×3): qty 2

## 2020-02-09 MED ORDER — AMIODARONE HCL IN DEXTROSE 360-4.14 MG/200ML-% IV SOLN
INTRAVENOUS | Status: AC
  Administered 2020-02-09: 03:00:00 150 mg via INTRAVENOUS
  Filled 2020-02-09: qty 200

## 2020-02-09 MED ORDER — VANCOMYCIN HCL 1750 MG/350ML IV SOLN
1750.0000 mg | Freq: Once | INTRAVENOUS | Status: AC
  Administered 2020-02-09: 12:00:00 1750 mg via INTRAVENOUS
  Filled 2020-02-09: qty 350

## 2020-02-09 MED ORDER — AMIODARONE HCL IN DEXTROSE 360-4.14 MG/200ML-% IV SOLN
30.0000 mg/h | INTRAVENOUS | Status: DC
  Administered 2020-02-09 (×3): 30 mg/h via INTRAVENOUS
  Filled 2020-02-09 (×2): qty 200

## 2020-02-09 MED ORDER — PIPERACILLIN-TAZOBACTAM 3.375 G IVPB
3.3750 g | Freq: Three times a day (TID) | INTRAVENOUS | Status: DC
  Administered 2020-02-09 – 2020-02-11 (×7): 3.375 g via INTRAVENOUS
  Filled 2020-02-09 (×7): qty 50

## 2020-02-09 MED ORDER — VANCOMYCIN HCL 1250 MG/250ML IV SOLN
1250.0000 mg | INTRAVENOUS | Status: DC
  Filled 2020-02-09: qty 250

## 2020-02-09 MED ORDER — AMIODARONE LOAD VIA INFUSION
150.0000 mg | Freq: Once | INTRAVENOUS | Status: AC
  Filled 2020-02-09: qty 83.34

## 2020-02-09 MED ORDER — AMIODARONE HCL IN DEXTROSE 360-4.14 MG/200ML-% IV SOLN
60.0000 mg/h | INTRAVENOUS | Status: DC
  Administered 2020-02-09 (×2): 60 mg/h via INTRAVENOUS
  Filled 2020-02-09: qty 200

## 2020-02-09 NOTE — Progress Notes (Signed)
Pharmacy Antibiotic Note  Lucas de La Chapman Moss Sr. is a 79 y.o. male admitted on 02/20/2020 with pneumonia.  Pharmacy has been consulted for Vanc and Zosyn dosing. CrCL - 33 ml/min  Plan: Vanc 1750 x 1, then 1250 mg IV q24hr Zosyn 3.375 gm IV q8hr (extended infusion) Monitor renal function, C&S and vanc levels as needed  Height: 5\' 2"  (157.5 cm) Weight: 89.9 kg (198 lb 3.1 oz) IBW/kg (Calculated) : 54.6  Temp (24hrs), Avg:97.6 F (36.4 C), Min:96.4 F (35.8 C), Max:99.1 F (37.3 C)  Recent Labs  Lab 02/04/2020 2324 02/15/2020 2325 02/03/20 0402 02/03/20 0805 02/04/20 1533 02/04/20 2111 02/05/20 0426 02/05/20 1633 02/06/20 0246 02/07/20 0454 02/08/20 0510 02/09/20 0327  WBC  --    < >  --    < >  --   --  21.5*  --  21.8* 19.7* 19.0* 21.1*  CREATININE  --    < >  --    < >  --   --  2.63* 2.34* 1.99* 2.48* 1.83* 1.70*  LATICACIDVEN 2.6*  --  1.7  --  1.8 1.5  --   --   --   --   --   --    < > = values in this interval not displayed.    Estimated Creatinine Clearance: 33.1 mL/min (A) (by C-G formula based on SCr of 1.7 mg/dL (H)).    No Known Allergies  Antimicrobials this admission: Vanc 12/15>>  Zosyn 12/15 >>   Thank you for allowing pharmacy to be a part of this patient's care.  1/16, PharmD, [facility] Clinical Pharmacist Please see AMION for all Pharmacists' Contact Phone Numbers 02/09/2020, 9:48 AM

## 2020-02-09 NOTE — Progress Notes (Addendum)
NAME:  Lucas Clay., MRN:  [id number], DOB:  1941/02/19, LOS: 6 ADMISSION DATE:  02/11/2020, CONSULTATION DATE:  02/03/20 REFERRING MD:  Jerral Ralph  CHIEF COMPLAINT:  Dyspnea, AMS  Brief History   79 yo male from [place] and visiting family in [place] presented with change in mental status, dyspnea, hypoxia with SpO2 in 50's.  Received COVID vaccine in [place].  Found to have COVID 19 pneumonia.  Tried on Bipap but failed to improve and developed A fib with RVR.  Required intubation.  Past Medical History  CAD, HTN, Decreased hearing, Cardiomyopathy  Significant Hospital Events   12/09 admitted overnight.  Intubated AM 12/9. 12/10 Remained stable on high vent requirements 12/11 Seizure - neurology consulted 12/13 prone position 12/14 proning stopped 12/15 still pressor dependent. CXR worse. Starting VAP coverage and sending sputum.  Consults:  Neurology  Procedures:  ETT 12/9 >  Rt IJ CVL 12/9 >>   Significant Diagnostic Tests:   CT head 12/9 > bilateral ethmoid sinus disease.  Echo 12/9 > low/normal hypokinesis of inferiorlateral walls  Doppler legs b/l 12/10 >> no DVT  CT head 12/11 >> small Lt SDH w/o change from 12/9  EEG 12/11 >> generalized irregular slow activity  Micro Data:  COVID 12/8 > Positive Blood 12/8 > S. Epidermidis, likely contaminant MRSA PCR 12/10 >> Positive Respiratory culture 12/15>>> Antimicrobials:  Azithro 12/8 > 12/13 Ceftriaxone 12/8 > 12/13 Remdesivir 12/8  vanc 12/15>>> Zosyn 12/15>>> Interim history/subjective:  No longer needing prone position  Objective:  Blood pressure (Abnormal) 90/57, pulse 92, temperature 99.1 F (37.3 C), temperature source Oral, resp. rate (Abnormal) 34, height 5\' 2"  (1.575 m), weight 89.9 kg, SpO2 100 %.    Vent Mode: PRVC FiO2 (%):  [50 %-100 %] 50 % Set Rate:  [34 bmp] 34 bmp Vt Set:  [400 mL] 400 mL PEEP:  [12 cmH20-14 cmH20] 12 cmH20 Plateau Pressure:  [25 cmH20-28 cmH20] 25 cmH20    Intake/Output Summary (Last 24 hours) at 02/09/2020 0909 Last data filed at 02/09/2020 0824 Gross per 24 hour  Intake 2118.47 ml  Output 1320 ml  Net 798.47 ml   Filed Weights   02/06/20 0346 02/07/20 0232 02/08/20 0500  Weight: 93.2 kg 92.6 kg 89.9 kg   Physical Exam: General remains sedated. Still on high fio2 and peep but room to come down HENT NCAT perrl. Periorbital edema. Orally intubated. Right IJ unremarkable Pulm equal but decreased bilaterally fio2 50% PEEP 12. Current Pplat 26, driving pressure 14 Card Reg irreg af w/ CVR currently abd soft tol TF Ext dependent edema Neuro heavily sedated   Assessment & Plan:   Acute hypoxic/hypercapnic respiratory failure with ARDS from COVID 19 pneumonia. Portable chest x-ray personally reviewed from 12/15 this demonstrates the endotracheal tube in satisfactory position.  Right IJ triple-lumen catheter in satisfactory position.  Cardiomegaly.  Bilateral right greater than left airspace disease comparing films aeration is actually significantly worse from the 14th -T-max 99.1, continues to have significant leukocytosis Plan Continuing low tidal volume ventilation Check sputum culture Going to initiate broad-spectrum antibiotics given chest x-ray change, persistent leukocytosis and low-grade fever, concerned about possible VAP Plateau pressure goal less than 30, driving pressure goal less than 15, allowing for permissive hypercarbia A.m. chest x-ray PF ratio goal greater than 150, prone positioning if less than 150 Day #6 baricitinib, will stop given concern for infection  Day #7 of systemic steroids, will continue slow taper   Circulatory shock, multifactorial.  Sepsis  from Covid, also consider impact of sedating medications and cardiomyopathy Plan Keep euvolemic Wean norepinephrine for mean arterial pressure greater than 65 Holding beta-blockade and antihypertensives   Acute metabolic encephalopathy 2nd to sepsis from  COVID infection, hypoxia, and azotemia. New onset seizure 12/11. Small Lt SDH. - appreciate input from neurology Plan Seizure precautions Keppra RASS goal -1  A fib - new onset this admission. Elevated troponin from demand ischemia in setting of hypoxia. Hx of CAD, HTN, pacemaker -He had recurrent rapid ventricular response again last night 12/15.  He remains on norepinephrine, amiodarone initiated Plan Continue Lipitor Holding aspirin, Plavix and heparin given subdural hematoma Holding outpatient isosorbide losartan and hydrochlorothiazide Unfortunately beta-blockade on hold due to ongoing hypotension Continue telemetry monitoring Amiodarone infusion  AKI from ATN in setting of hypoxia, sepsis. -Not clear what his baseline is.  Serum creatinine improving daily, BUN remains quite high Plan Continue renal dose medications Avoid hypotension Strict intake output Repeat a.m. chemistry   Steroid induced hyperglycemia. -Glycemic control is excellent Plan Continue current Levemir and sliding scale regimen Goal glucose 140-180  Best Practice:  Diet: tube feeds at goal DVT prophylaxis: SCD Sedation protocol: change RASS goal to -2 effective 12/15 GI prophylaxis: protonix Mobility: bed rest Code Status: No CPR, no defibrillation Last goals of care discussed w/ Dr Everardo All 12/12. He is now day 7 on vent. Will reach out via spanish interpreter again today. I am concerned about possibility of infection. Has made some marginal improvements/stabalized but prognosis still terrible   Disposition:  ICU   Critical care time: 32 min  Simonne Martinet ACNP-BC [facility] Pulmonary/Critical Care Pager # [phone] OR # [phone] if no answer

## 2020-02-09 NOTE — Progress Notes (Signed)
eLink Physician-Brief Progress Note Patient Name: Lucas Shealy Sr. DOB: [birth year] MRN: [id number]   Date of Service  02/09/2020  HPI/Events of Note  AFIB with RVR - Ventricular rate = 137. BP = 118/52 on Norepinephrine IV infusion.   eICU Interventions  Plan: 1. Amiodarone IV load and infusion.  2. Send AM CBC and BMP now. 3. Mg++ level STAT.      Intervention Category Major Interventions: Arrhythmia - evaluation and management  Yasmene Salomone Eugene 02/09/2020, 2:57 AM

## 2020-02-10 DIAGNOSIS — J9600 Acute respiratory failure, unspecified whether with hypoxia or hypercapnia: Secondary | ICD-10-CM | POA: Diagnosis not present

## 2020-02-10 DIAGNOSIS — U071 COVID-19: Secondary | ICD-10-CM | POA: Diagnosis not present

## 2020-02-10 LAB — GLUCOSE, CAPILLARY
Glucose-Capillary: 195 mg/dL — ABNORMAL HIGH (ref 70–99)
Glucose-Capillary: 154 mg/dL — ABNORMAL HIGH (ref 70–99)
Glucose-Capillary: 138 mg/dL — ABNORMAL HIGH (ref 70–99)
Glucose-Capillary: 168 mg/dL — ABNORMAL HIGH (ref 70–99)
Glucose-Capillary: 163 mg/dL — ABNORMAL HIGH (ref 70–99)

## 2020-02-10 LAB — COMPREHENSIVE METABOLIC PANEL
ALT: 12 U/L (ref 0–44)
AST: 16 U/L (ref 15–41)
Albumin: 1.5 g/dL — ABNORMAL LOW (ref 3.5–5.0)
Alkaline Phosphatase: 41 U/L (ref 38–126)
Anion gap: 11 (ref 5–15)
BUN: 114 mg/dL — ABNORMAL HIGH (ref 8–23)
CO2: 20 mmol/L — ABNORMAL LOW (ref 22–32)
Calcium: 8.1 mg/dL — ABNORMAL LOW (ref 8.9–10.3)
Chloride: 103 mmol/L (ref 98–111)
Creatinine, Ser: 1.89 mg/dL — ABNORMAL HIGH (ref 0.61–1.24)
GFR, Estimated: 35 mL/min — ABNORMAL LOW (ref >60)
Glucose, Bld: 203 mg/dL — ABNORMAL HIGH (ref 70–99)
Potassium: 5.4 mmol/L — ABNORMAL HIGH (ref 3.5–5.1)
Sodium: 134 mmol/L — ABNORMAL LOW (ref 135–145)
Total Bilirubin: 0.8 mg/dL (ref 0.3–1.2)
Total Protein: 4.8 g/dL — ABNORMAL LOW (ref 6.5–8.1)

## 2020-02-10 LAB — CBC
HCT: 33.1 % — ABNORMAL LOW (ref 39.0–52.0)
Hemoglobin: 10.6 g/dL — ABNORMAL LOW (ref 13.0–17.0)
MCH: 29.4 pg (ref 26.0–34.0)
MCHC: 32 g/dL (ref 30.0–36.0)
MCV: 91.9 fL (ref 80.0–100.0)
Platelets: 224 10*3/uL (ref 150–400)
RBC: 3.6 MIL/uL — ABNORMAL LOW (ref 4.22–5.81)
RDW: 15.1 % (ref 11.5–15.5)
WBC: 21.3 10*3/uL — ABNORMAL HIGH (ref 4.0–10.5)
nRBC: 0.2 % (ref 0.0–0.2)

## 2020-02-10 LAB — BASIC METABOLIC PANEL
Anion gap: 13 (ref 5–15)
BUN: 116 mg/dL — ABNORMAL HIGH (ref 8–23)
CO2: 22 mmol/L (ref 22–32)
Calcium: 8.3 mg/dL — ABNORMAL LOW (ref 8.9–10.3)
Chloride: 99 mmol/L (ref 98–111)
Creatinine, Ser: 2.03 mg/dL — ABNORMAL HIGH (ref 0.61–1.24)
GFR, Estimated: 32 mL/min — ABNORMAL LOW (ref >60)
Glucose, Bld: 198 mg/dL — ABNORMAL HIGH (ref 70–99)
Potassium: 5 mmol/L (ref 3.5–5.1)
Sodium: 134 mmol/L — ABNORMAL LOW (ref 135–145)

## 2020-02-10 LAB — TRIGLYCERIDES: Triglycerides: 51 mg/dL (ref <150)

## 2020-02-10 MED ORDER — VANCOMYCIN HCL IN DEXTROSE 1-5 GM/200ML-% IV SOLN: 1000.0000 mg | INTRAVENOUS | Status: DC

## 2020-02-10 MED ORDER — INSULIN DETEMIR 100 UNIT/ML ~~LOC~~ SOLN
12.0000 [IU] | Freq: Every day | SUBCUTANEOUS | Status: DC
  Administered 2020-02-11: 10:00:00 12 [IU] via SUBCUTANEOUS
  Filled 2020-02-10: qty 0.12

## 2020-02-10 NOTE — Progress Notes (Signed)
RT NOTES: A-line dressing changed and pressure bag/tubing changed.

## 2020-02-10 NOTE — Progress Notes (Signed)
Pharmacy Antibiotic Note  Lucas de La Chapman Moss Sr. is a 79 y.o. male admitted on 01/26/2020 with pneumonia.  Pharmacy has been consulted for Vanc and Zosyn dosing. SCr back up a bit to 1.89.  Plan: Adjust vancomycin to 1g IV q24h for renal function Zosyn 3.375 gm IV q8hr (extended infusion) Monitor clinical progress, c/s, renal function F/u de-escalation plan/LOT, vancomycin levels as indicated   Height: 5\' 2"  (157.5 cm) Weight: 89.9 kg (198 lb 3.1 oz) IBW/kg (Calculated) : 54.6  Temp (24hrs), Avg:98.8 F (37.1 C), Min:98.1 F (36.7 C), Max:99.8 F (37.7 C)  Recent Labs  Lab 02/04/20 1533 02/04/20 2111 02/05/20 0426 02/06/20 0246 02/07/20 0454 02/08/20 0510 02/09/20 0327 02/10/20 0337  WBC  --   --    < > 21.8* 19.7* 19.0* 21.1* 21.3*  CREATININE  --   --    < > 1.99* 2.48* 1.83* 1.70* 1.89*  LATICACIDVEN 1.8 1.5  --   --   --   --   --   --    < > = values in this interval not displayed.    Estimated Creatinine Clearance: 29.8 mL/min (A) (by C-G formula based on SCr of 1.89 mg/dL (H)).    No Known Allergies  Vanc 12/15>>  Zosyn 12/15 >>   12/8 covid+ 12/8 bcx - neg 12/9 bcx (1 set) - 2/2 staph epi 12/10 MRSA PCR - positive 12/15 TA - abundant GNR, rare GPC   1/16, PharmD, BCPS Please check AMION for all [facility] Pharmacy contact numbers Clinical Pharmacist 02/10/2020 9:07 AM

## 2020-02-10 NOTE — Progress Notes (Signed)
NAME:  Lucas Daughtridge., MRN:  [id number], DOB:  1940-05-13, LOS: 7 ADMISSION DATE:  Feb 04, 2020, CONSULTATION DATE:  02/03/20 REFERRING MD:  Jerral Ralph  CHIEF COMPLAINT:  Dyspnea, AMS  Brief History   79 yo male from [place] and visiting family in [place] presented with change in mental status, dyspnea, hypoxia with SpO2 in 50's.  Received COVID vaccine in [place].  Found to have COVID 19 pneumonia.  Tried on Bipap but failed to improve and developed A fib with RVR.  Required intubation.  Past Medical History  CAD, HTN, Decreased hearing, Cardiomyopathy  Significant Hospital Events   12/09 admitted overnight.  Intubated AM 12/9. 12/10 Remained stable on high vent requirements 12/11 Seizure - neurology consulted 12/13 prone position 12/14 proning stopped 12/15 still pressor dependent. CXR worse. Starting VAP coverage and sending sputum. Stopped baricitinib given concern for possible infection. Spoke to family/son via Spanish interpreter instructed him to talk with remaining family, as prognosis becoming increasingly poor for any sort of meaningful recovery, recommended no escalation minimally 12/16: Oxygen requirements and PEEP increased. Still pressor dependent. Growing Pseudomonas in sputum, vancomycin discontinued awaiting further sensitivity data. Discontinuing arterial line on left side, has areas of necrosis on left fingers blood as well on left toes Consults:  Neurology  Procedures:  ETT 12/9 >  Rt IJ CVL 12/9 >>   Significant Diagnostic Tests:   CT head 12/9 > bilateral ethmoid sinus disease.  Echo 12/9 > low/normal hypokinesis of inferiorlateral walls  Doppler legs b/l 12/10 >> no DVT  CT head 12/11 >> small Lt SDH w/o change from 12/9  EEG 12/11 >> generalized irregular slow activity  Micro Data:  COVID 12/8 > Positive Blood 12/8 > S. Epidermidis, likely contaminant MRSA PCR 12/10 >> Positive Respiratory culture 12/15>>>pseudomonas>>> Antimicrobials:   Azithro 12/8 > 12/13 Ceftriaxone 12/8 > 12/13 Remdesivir 12/8  vanc 12/15>>> discontinued 12/16 Zosyn 12/15>>> Interim history/subjective:  FiO2 as well as blood pressor requirements increased  Objective:  Blood pressure (Abnormal) 138/46, pulse 73, temperature 98.8 F (37.1 C), temperature source Oral, resp. rate (Abnormal) 34, height 5\' 2"  (1.575 m), weight 89.9 kg, SpO2 93 %.    Vent Mode: PRVC FiO2 (%):  [50 %-70 %] 70 % Set Rate:  [34 bmp] 34 bmp Vt Set:  [400 mL] 400 mL PEEP:  [10 cmH20-12 cmH20] 10 cmH20 Plateau Pressure:  [22 cmH20-28 cmH20] 28 cmH20   Intake/Output Summary (Last 24 hours) at 02/10/2020 1052 Last data filed at 02/10/2020 1005 Gross per 24 hour  Intake 4352.82 ml  Output 1730 ml  Net 2622.82 ml   Filed Weights   02/06/20 0346 02/07/20 0232 02/08/20 0500  Weight: 93.2 kg 92.6 kg 89.9 kg   Physical Exam: General: This is a 79 year old male, remains heavily sedated, remains on high ventilatory support, pressor requirements remain high. HEENT normocephalic atraumatic pupils equal and reactive orally intubated right IJ triple-lumen catheter is unremarkable Pulmonary: Diminished bilaterally, equal breath sounds. Remains on PEEP 10, FiO2 of 70% this reflects higher oxygen requirements than compared to day prior. Current plateau pressure 26 Cardiac: Regular rate and rhythm currently sinus rhythm Extremities: Cool, mottled. Has areas of what looks like early necrosis on left foot, as well as left tips of fingers. Diffuse anasarca. Neuro: Heavily sedated GU: Decreased urine output  Assessment & Plan:   Acute hypoxic/hypercapnic respiratory failure with ARDS from COVID 19 pneumonia. HCAP started 12/15, preliminary respiratory culture showing moderate Pseudomonas  Plan continue full ventilator support titrating  PEEP and FiO2 Can discontinue vancomycin, continue's Zosyn for now awaiting sensitivities, if resistant would go to meropenem given recent  seizures PAD protocol RASS goal -2 to -3 Plateau pressure goal less than 30, driving pressure goal less than 15 A.m. chest x-ray VAP bundle Continue slow steroid taper  Circulatory shock, multifactorial.  Sepsis from Covid, also consider impact of sedating medications and cardiomyopathy Plan Continue to titrate norepinephrine for mean arterial pressure greater than 65 Holding beta-blockade and antihypertensives given shock   New areas of peripheral necrosis Suspect secondary to septic shock, this includes left fingers as well as left toes Plan Discontinue left A-line Continue supportive care Unfortunately little else to offer here   Acute metabolic encephalopathy 2nd to sepsis from COVID infection, hypoxia, and azotemia. New onset seizure 12/11. Small Lt SDH. - appreciate input from neurology Plan Continue seizure precautions Continue Keppra PAD protocol as above  A fib - new onset this admission. Elevated troponin from demand ischemia in setting of hypoxia. Hx of CAD, HTN, pacemaker -Back in normal sinus rhythm Plan Continue Lipitor  Holding aspirin and Plavix given subdural hematoma  Holding isosorbide losartan and hydrochlorothiazide as well as beta-blockade given ongoing shock  Discontinue amiodarone  Continue telemetry    AKI from [facility]N with mild hyperkalemia and evolving metabolic acidosis in setting of hypoxia, sepsis. -Not clear what his baseline is. Serum creatinine climbing once again. Given his advanced age and multiple comorbidities I do not think he is a good dialysis candidate Plan Continue to renally adjust medications Treat hypotension Strict intake output A.m. chemistry  Steroid induced hyperglycemia. -Glycemic control is excellent Plan Continue sliding scale insulin and basal Lantus  Best Practice:  Diet: tube feeds at goal DVT prophylaxis: SCD Sedation protocol: change RASS goal to RASS goal -2 to -3 GI prophylaxis: protonix Mobility: bed  rest Code Status: No CPR, no defibrillation Last goals of care: This was discussed on 12/15 with the patient's son via Spanish interpreter over the phone. Unfortunately multidisciplinary discussion was not possible. We discussed patient's current condition, underlying medical problems, and poor prognosis. He remains a DO NOT RESUSCITATE, but family/son had requested we continue aggressive therapy short of this. The plan at this point is to continue all therapies, in the meantime the patient's son was going to engage the rest of the family, we are going to revisit goals of care if the interpreter and telephone on 12/17, I do not think given his lack of progress and underlying comorbidities that escalation of care would be appropriate in this setting   disposition:  ICU   Critical care time:18min  Simonne Martinet ACNP-BC [facility] Pulmonary/Critical Care Pager # [phone] OR # [phone] if no answer

## 2020-02-10 NOTE — Progress Notes (Signed)
Nutrition Follow-up  DOCUMENTATION CODES:   Not applicable  INTERVENTION:   Initiate tube feeding via OG tube: Vital AF 1.2 at 50 ml/h (1200 ml per day) Prosource TF 45 ml TID Provides 1560 kcal, 123 gm protein, 973 ml free water daily  TF regimen and propofol at current rate providing 2080 total kcal/day   NUTRITION DIAGNOSIS:   Increased nutrient needs related to acute illness (COVID ARDS) as evidenced by estimated needs.  Being addressed via TF   GOAL:   Patient will meet greater than or equal to 90% of their needs  Met  MONITOR:   Vent status,TF tolerance,Labs  REASON FOR ASSESSMENT:   Ventilator,Consult Enteral/tube feeding initiation and management  ASSESSMENT:   79 yo male admitted with respiratory failure, COVID ARDS, NSTEMI, pulmonary edema. Patient is visiting his son from [place]. [place] includes ischemic cardiomyopathy, HTN, pacemaker.  12/09 Intubated 12/11 +seizure 12/13 prone position 12/14 proning stopped 12/15 HCAP-pseudomonas  Pt is a DNR, not progressing well per MD notes with worsening vent and pressor requirements  Pt remains on vent support, not currently requiring prone positioning  Propofol: 19.7 ml/hr  Vital AF 1.2 at 50 ml/hr, Pro-Source TF 45 mL TID via OG tube  Weight 89.9kg; net +10 L. Admit weight 82.2 kg  Noted per MD notes, extremities cool, mottled with  necrotic-appearing areas on L foot and tips of L fingers. +edema in all extremities   Labs:  Potassium 5.0, sodium 134, BUN 116, Creatinine 2 Meds: ss novolog, levemir, prednisone  Diet Order:   Diet Order            Diet NPO time specified  Diet effective now                 EDUCATION NEEDS:   Not appropriate for education at this time  Skin:  Skin Assessment: Reviewed RN Assessment  Last BM:  12/14 rectal tube  Height:   Ht Readings from Last 1 Encounters:  02/03/20 _0  (1.575 m)    Weight:   Wt Readings from Last 1 Encounters:  02/08/20 89.9 kg     BMI:  Body mass index is 36.25 kg/m.  Estimated Nutritional Needs:   Kcal:  1700-1900 kcals  Protein:  110-125 gm  Fluid:  >/= 1.7 L   Kerman Passey MS, RDN, LDN, CNSC Registered Dietitian III Clinical Nutrition RD Pager and On-Call Pager Number Located in [place]

## 2020-02-11 ENCOUNTER — Inpatient Hospital Stay (HOSPITAL_COMMUNITY): Payer: HRSA Program

## 2020-02-11 DIAGNOSIS — U071 COVID-19: Secondary | ICD-10-CM | POA: Diagnosis not present

## 2020-02-11 DIAGNOSIS — A498 Other bacterial infections of unspecified site: Secondary | ICD-10-CM | POA: Diagnosis present

## 2020-02-11 LAB — CBC
HCT: 32.9 % — ABNORMAL LOW (ref 39.0–52.0)
Hemoglobin: 10.5 g/dL — ABNORMAL LOW (ref 13.0–17.0)
MCH: 29.3 pg (ref 26.0–34.0)
MCHC: 31.9 g/dL (ref 30.0–36.0)
MCV: 91.9 fL (ref 80.0–100.0)
Platelets: 268 10*3/uL (ref 150–400)
RBC: 3.58 MIL/uL — ABNORMAL LOW (ref 4.22–5.81)
RDW: 15.4 % (ref 11.5–15.5)
WBC: 24.6 10*3/uL — ABNORMAL HIGH (ref 4.0–10.5)
nRBC: 0.2 % (ref 0.0–0.2)

## 2020-02-11 LAB — CULTURE, RESPIRATORY W GRAM STAIN

## 2020-02-11 LAB — COMPREHENSIVE METABOLIC PANEL
ALT: 16 U/L (ref 0–44)
AST: 18 U/L (ref 15–41)
Albumin: 1.4 g/dL — ABNORMAL LOW (ref 3.5–5.0)
Alkaline Phosphatase: 44 U/L (ref 38–126)
Anion gap: 11 (ref 5–15)
BUN: 120 mg/dL — ABNORMAL HIGH (ref 8–23)
CO2: 23 mmol/L (ref 22–32)
Calcium: 8.5 mg/dL — ABNORMAL LOW (ref 8.9–10.3)
Chloride: 104 mmol/L (ref 98–111)
Creatinine, Ser: 1.98 mg/dL — ABNORMAL HIGH (ref 0.61–1.24)
GFR, Estimated: 33 mL/min — ABNORMAL LOW (ref >60)
Glucose, Bld: 165 mg/dL — ABNORMAL HIGH (ref 70–99)
Potassium: 5.4 mmol/L — ABNORMAL HIGH (ref 3.5–5.1)
Sodium: 138 mmol/L (ref 135–145)
Total Bilirubin: 0.5 mg/dL (ref 0.3–1.2)
Total Protein: 5 g/dL — ABNORMAL LOW (ref 6.5–8.1)

## 2020-02-11 LAB — GLUCOSE, CAPILLARY
Glucose-Capillary: 142 mg/dL — ABNORMAL HIGH (ref 70–99)
Glucose-Capillary: 152 mg/dL — ABNORMAL HIGH (ref 70–99)
Glucose-Capillary: 156 mg/dL — ABNORMAL HIGH (ref 70–99)
Glucose-Capillary: 165 mg/dL — ABNORMAL HIGH (ref 70–99)
Glucose-Capillary: 176 mg/dL — ABNORMAL HIGH (ref 70–99)
Glucose-Capillary: 185 mg/dL — ABNORMAL HIGH (ref 70–99)
Glucose-Capillary: 202 mg/dL — ABNORMAL HIGH (ref 70–99)

## 2020-02-11 MED ORDER — NOREPINEPHRINE 4 MG/250ML-% IV SOLN
0.0000 ug/min | INTRAVENOUS | Status: DC
  Administered 2020-02-11 (×2): 8 ug/min via INTRAVENOUS
  Administered 2020-02-12: 05:00:00 6 ug/min via INTRAVENOUS
  Filled 2020-02-11 (×2): qty 250

## 2020-02-11 NOTE — Progress Notes (Signed)
Assisted tele visit to patient with son.  Giancarlos Berendt M, RN  

## 2020-02-11 NOTE — Progress Notes (Signed)
Spoke with patient's son on the phone- son states that he will be here tomorrow morning at 10 am to visit his dad prior to the withdrawal of care as stated in a previous note from critical care NP. Until this time, nursing staff will not escalate care, but will continue supportive care on current ventilator and blood pressure support.   Discussed this plan with MD who confirms we will not escalate care via blood pressure medication, check/treat blood sugars, or administer antibiotics at this time.   Son asked that we update him with changes throughout the night. All questions answered to son's satisfaction.

## 2020-02-11 NOTE — Progress Notes (Signed)
NAME:  Lucas Clay., MRN:  [id number], DOB:  [birth year], LOS: 8 ADMISSION DATE:  01/27/2020, CONSULTATION DATE:  02/03/20 REFERRING MD:  Jerral Ralph  CHIEF COMPLAINT:  Dyspnea, AMS  Brief History   79 yo male from [place] and visiting family in [place] presented with change in mental status, dyspnea, hypoxia with SpO2 in 50's.  Received COVID vaccine in [place].  Found to have COVID 19 pneumonia.  Tried on Bipap but failed to improve and developed A fib with RVR.  Required intubation.  Past Medical History  CAD, HTN, Decreased hearing, Cardiomyopathy  Significant Hospital Events   12/09 admitted overnight.  Intubated AM 12/9. 12/10 Remained stable on high vent requirements 12/11 Seizure - neurology consulted 12/13 prone position 12/14 proning stopped 12/15 still pressor dependent. CXR worse. Starting VAP coverage and sending sputum. Stopped baricitinib given concern for possible infection. Spoke to family/son via Spanish interpreter instructed him to talk with remaining family, as prognosis becoming increasingly poor for any sort of meaningful recovery, recommended no escalation minimally 12/16: Oxygen requirements and PEEP increased. Still pressor dependent. Growing Pseudomonas in sputum, vancomycin discontinued awaiting further sensitivity data. Discontinuing arterial line on left side, has areas of necrosis on left fingers blood as well on left toes 12/17: Remains on high-dose pressors.  BUN climbing.  Sputum culture sensitivities back.  Pseudomonas pansensitive.  Oxygen requirements increased.  Overall has not improved. Consults:  Neurology  Procedures:  ETT 12/9 >  Rt IJ CVL 12/9 >>   Significant Diagnostic Tests:   CT head 12/9 > bilateral ethmoid sinus disease.  Echo 12/9 > low/normal hypokinesis of inferiorlateral walls  Doppler legs b/l 12/10 >> no DVT  CT head 12/11 >> small Lt SDH w/o change from 12/9  EEG 12/11 >> generalized irregular slow activity  Micro  Data:  COVID 12/8 > Positive Blood 12/8 > S. Epidermidis, likely contaminant MRSA PCR 12/10 >> Positive Respiratory culture 12/15>>>pseudomonas, pansensitive Antimicrobials:  Azithro 12/8 > 12/13 Ceftriaxone 12/8 > 12/13 Remdesivir 12/8  vanc 12/15>>> discontinued 12/16 Zosyn 12/15>>> Interim history/subjective:  Shock state continues.  No improvement  Objective:  Blood pressure (Abnormal) 93/54, pulse (Abnormal) 101, temperature 98.2 F (36.8 C), temperature source Axillary, resp. rate (Abnormal) 34, height 5\' 2"  (1.575 m), weight 89.9 kg, SpO2 90 %.    Vent Mode: PRVC FiO2 (%):  [60 %-75 %] 75 % Set Rate:  [34 bmp] 34 bmp Vt Set:  [400 mL] 400 mL PEEP:  [10 cmH20] 10 cmH20 Plateau Pressure:  [27 cmH20] 27 cmH20   Intake/Output Summary (Last 24 hours) at 02/11/2020 1018 Last data filed at 02/11/2020 0800 Gross per 24 hour  Intake 1573.11 ml  Output 1480 ml  Net 93.11 ml   Filed Weights   02/07/20 0232 02/08/20 0500 02/11/20 0331  Weight: 92.6 kg 89.9 kg 89.9 kg   Physical Exam: General 79 year old male patient currently heavily sedated no acute distress currently on sedating drips HEENT normocephalic atraumatic pupils equal reactive orally intubated Pulmonary: Diminished bilaterally with some scattered rhonchi.  Currently Plateau pressures 27.  Current FiO2 75%/PEEP 10. Cardiac: Regular rate and rhythm Abdomen: Soft not tender Extremities: Warm dry with brisk capillary refill today with the exception of ongoing necrosis of left index finger, and toes on the left foot. Neuro: Unresponsive GU: Excellent urine output   Assessment & Plan:   Acute hypoxic/hypercapnic respiratory failure with ARDS from COVID 19 pneumonia. HCAP started 12/15, preliminary respiratory culture showing moderate Pseudomonas Portable chest x-ray  personally reviewed with Cardiomegaly, ongoing bilateral airspace disease looks a little worse today comparing film on the 15th Increased oxygen  requirements, pansensitive Pseudomonas from sensitivities Plan Continue full ventilator support  Day #2 of 10 Zosyn  PAD protocol RASS goal -2 to -3 Plateau pressure goal less than 30, driving pressure goal less than 15 Continue slow steroid taper As needed chest x-ray VAP bundle Would benefit from diuresis however shock state preventing this   Circulatory shock, multifactorial.  Sepsis from Covid, also consider impact of sedating medications and cardiomyopathy Plan Continue to titrate norepinephrine for mean arterial pressure greater than 65 Treating infection Keep euvolemic Holding antihypertensive  New areas of peripheral necrosis Suspect secondary to septic shock, this includes left fingers as well as left toes arterial line was removed from left hand little warmer today Plan Supportive care  Unfortunately little to offer here discontinue   Acute metabolic encephalopathy 2nd to sepsis from COVID infection, hypoxia, and azotemia. New onset seizure 12/11. Small Lt SDH. - appreciate input from neurology Plan PAD protocol  Keppra  Seizure precautions   A fib - new onset this admission. Elevated troponin from demand ischemia in setting of hypoxia. Hx of CAD, HTN, pacemaker -Back in normal sinus rhythm Plan Cont lipitor Holding aspirin and Plavix given subdural hematoma Holding isosorbide, losartan, hydrochlorothiazide and beta-blockade with ongoing septic shock  Continue telemetry monitoring  AKI from [facility] with mild hyperkalemia and evolving metabolic acidosis in setting of hypoxia, sepsis. -Not clear what his baseline is. Serum creatinine climbing once again. Given his advanced age and multiple comorbidities I do not think he is a good dialysis candidate. BUN climbing. Cr about same. Good UOP Plan Avoid hypotension Renal adjust meds Strict I&O Am chemistry   Steroid induced hyperglycemia. -Glycemic control is excellent Plan Cont ssi and levemir   Best Practice:   Diet: tube feeds at goal DVT prophylaxis: SCD Sedation protocol: change RASS goal to RASS goal -2 to -3 GI prophylaxis: protonix Mobility: bed rest Code Status: No CPR, no defibrillation Last goals of care: This was discussed on 12/15 with the patient's son via Spanish interpreter over the phone. Unfortunately multidisciplinary discussion was not possible. We discussed patient's current condition, underlying medical problems, and poor prognosis. He remains a DO NOT RESUSCITATE, but family/son had requested we continue aggressive therapy short of this. The plan at this point is to continue all therapies, in the meantime the patient's son was going to engage the rest of the family, we are going to revisit goals of care if the interpreter and telephone on 12/17, I do not think given his lack of progress and underlying comorbidities that escalation of care would be appropriate in this setting   disposition:  ICU   Critical care time:44min  Simonne Martinet ACNP-BC [facility] Pulmonary/Critical Care Pager # [phone] OR # [phone] if no answer

## 2020-02-11 NOTE — Progress Notes (Signed)
Spoke again w/ Lucas Clay. He has spoken to family. They all agree that they would desire that Lucas Clay die peacefully and would like to proceed as follows: 1) dc pressors, nutrition, antibiotics 2) decrease vent to minimum settings but do not extubate (they are afraid this will cause increased discomfort and feel strongly about this) 3) titrate narcotic and other medications for comfort.   Lucas Clay will call nursing staff and coordinate visiting. After that we will proceed w/ plan as above  Simonne Martinet ACNP-BC [facility] Pulmonary/Critical Care Pager # [phone] OR # [phone] if no answer

## 2020-02-11 NOTE — Progress Notes (Signed)
I spoke to [employer]. I explained to him his father's clinical decline. I explained poor prognosis and likelihood of hospital death or life long debilitation including possibility of needing prolonged ventilation, SNF, and trach.  I have asked him to discuss w/ his family if we should continue to provide this level of support with prognosis being so grim  Plan He will call his sister.  Will re-visit this later today  I did tell him if we are withdrawing we could make arrangements for him to have some time to see him and say good bye.  For now cont current measures  Simonne Martinet ACNP-BC [facility] Pulmonary/Critical Care Pager # [phone] OR # [phone] if no answer

## 2020-02-12 DIAGNOSIS — J80 Acute respiratory distress syndrome: Secondary | ICD-10-CM | POA: Diagnosis not present

## 2020-02-12 DIAGNOSIS — U071 COVID-19: Secondary | ICD-10-CM | POA: Diagnosis not present

## 2020-02-12 MED ORDER — POLYVINYL ALCOHOL 1.4 % OP SOLN
1.0000 [drp] | Freq: Four times a day (QID) | OPHTHALMIC | Status: DC | PRN
  Filled 2020-02-12: qty 15

## 2020-02-12 MED ORDER — LORAZEPAM 2 MG/ML IJ SOLN: 2.0000 mg | INTRAMUSCULAR | Status: DC | PRN

## 2020-02-12 MED ORDER — LORAZEPAM 2 MG/ML IJ SOLN
INTRAMUSCULAR | Status: AC
  Administered 2020-02-12: 13:00:00 2 mg
  Filled 2020-02-12: qty 1

## 2020-02-12 MED ORDER — DIPHENHYDRAMINE HCL 50 MG/ML IJ SOLN: 25.0000 mg | INTRAMUSCULAR | Status: DC | PRN

## 2020-02-12 MED ORDER — GLYCOPYRROLATE 1 MG PO TABS: 1.0000 mg | ORAL_TABLET | ORAL | Status: DC | PRN

## 2020-02-12 MED ORDER — GLYCOPYRROLATE 0.2 MG/ML IJ SOLN: 0.2000 mg | INTRAMUSCULAR | Status: DC | PRN

## 2020-02-26 NOTE — Progress Notes (Signed)
NAME:  Lucas Clay., MRN:  [id number], DOB:  04/11/1940, LOS: 9 ADMISSION DATE:  02/05/2020, CONSULTATION DATE:  02/03/20 REFERRING MD:  Jerral Ralph  CHIEF COMPLAINT:  Dyspnea, AMS  Brief History   80 yo male from [place] and visiting family in [place] presented with change in mental status, dyspnea, hypoxia with SpO2 in 50's.  Received COVID vaccine in [place].  Found to have COVID 19 pneumonia.  Tried on Bipap but failed to improve and developed A fib with RVR.  Required intubation.  Past Medical History  CAD, HTN, Decreased hearing, Cardiomyopathy  Significant Hospital Events   12/09 admitted overnight.  Intubated AM 12/9. 12/10 Remained stable on high vent requirements 12/11 Seizure - neurology consulted 12/13 prone position 12/14 proning stopped 12/15 still pressor dependent. CXR worse. Starting VAP coverage and sending sputum. Stopped baricitinib given concern for possible infection. Spoke to family/son via Spanish interpreter instructed him to talk with remaining family, as prognosis becoming increasingly poor for any sort of meaningful recovery, recommended no escalation minimally 12/16: Oxygen requirements and PEEP increased. Still pressor dependent. Growing Pseudomonas in sputum, vancomycin discontinued awaiting further sensitivity data. Discontinuing arterial line on left side, has areas of necrosis on left fingers blood as well on left toes 12/17: Remains on high-dose pressors.  BUN climbing.  Sputum culture sensitivities back.  Pseudomonas pansensitive.  Oxygen requirements increased.  Overall has not improved.  Consults:  Neurology  Procedures:  ETT 12/9 >  Rt IJ CVL 12/9 >>   Significant Diagnostic Tests:   CT head 12/9 > bilateral ethmoid sinus disease.  Echo 12/9 > low/normal hypokinesis of inferiorlateral walls  Doppler legs b/l 12/10 >> no DVT  CT head 12/11 >> small Lt SDH w/o change from 12/9  EEG 12/11 >> generalized irregular slow  activity  Micro Data:  COVID 12/8 > Positive Blood 12/8 > S. Epidermidis, likely contaminant MRSA PCR 12/10 >> Positive Respiratory culture 12/15>>>pseudomonas, pansensitive  Antimicrobials:  Azithro 12/8 > 12/13 Ceftriaxone 12/8 > 12/13 Remdesivir 12/8  vanc 12/15>>> discontinued 12/16 Zosyn 12/15>>>   Interim history/subjective:  No changes/ or improvements   Objective:  Blood pressure (!) 133/50, pulse 86, temperature 97.6 F (36.4 C), temperature source Axillary, resp. rate (!) 34, height 5\' 2"  (1.575 m), weight 89.9 kg, SpO2 96 %.    Vent Mode: PRVC FiO2 (%):  [85 %] 85 % Set Rate:  [34 bmp] 34 bmp Vt Set:  [400 mL] 400 mL PEEP:  [8 cmH20-10 cmH20] 8 cmH20 Plateau Pressure:  [24 cmH20-28 cmH20] 28 cmH20   Intake/Output Summary (Last 24 hours) at 03-13-2020 1249 Last data filed at 2020-03-13 1236 Gross per 24 hour  Intake 1789.23 ml  Output 2250 ml  Net -460.77 ml   Filed Weights   02/07/20 0232 02/08/20 0500 02/11/20 0331  Weight: 92.6 kg 89.9 kg 89.9 kg   Physical Exam: General:  Critically ill appearing male sedated/ intubated on MV HEENT: MM pink/moist/ ETT/ OGT, pupils 2/ reactive Neuro: unresponsive/ sedated CV: rr, no murmur PULM:  MV supported breaths, coarse throughout, rales right base GI: obese, +bs Extremities: warm/dry, anasarca, necrosis to left index finger and left toes   Assessment & Plan:   Acute hypoxic/hypercapnic respiratory failure with ARDS from COVID 19 pneumonia. HCAP started 12/15, preliminary respiratory culture showing moderate Pseudomonas Circulatory shock, multifactorial.  Sepsis from Covid, also consider impact of sedating medications and cardiomyopathy New areas of peripheral necrosis Acute metabolic encephalopathy 2nd to sepsis from COVID infection,  hypoxia, and azotemia. New onset seizure 12/11 Small Lt SDH A fib - new onset this admission. Elevated troponin from demand ischemia in setting of hypoxia. Hx of CAD, HTN,  pacemaker AKI from ATN with mild hyperkalemia and evolving metabolic acidosis in setting of hypoxia, sepsis. Steroid induced hyperglycemia. P:  Son at bedside, face timing with multiple family members prior to transitioning to comfort care today as patient has failed to make any progression despite maximal medical care.  I did at length discuss with son about one way extubation as the family had requested to leave him intubated "for comfort" however I explained in detail my concern that this would prolong his suffering and allow him a more natural passing and that we would treat any pain/ anxiety/ dyspnea.   After discussing with his family, family decided to leave ETT in but discontinue everything else.  1250: Son has left bedside.  Does not want to be present, will remain on campus, and wishes to be notified when patient passes.   P:  Transition to full comfort care, except for extubation.  Will change mode to PSV.  Will d/c propofol, continue fentanyl gtt, add prn ativan, benadryl, and robinul.   If seizures reoccur, consider restarting propofol for palliation if ativan does not help.   D/c all other meds, including abx, vasopressor support/ NE.   Ongoing emotional support offered to family.    Critical care time: 40 min     Posey Boyer, ACNP [facility] 02/18/2020, 1:05 PM

## 2020-02-26 NOTE — Progress Notes (Signed)
Per family wishes, vasoactive medications turned off, weaned vent to 30% FiO2, placed magnet over pacemaker, and transitioned to comfort care measures at 1243. Pt resting peacefully and not in distress. RT and RN at bedside.  Patient expired at 1312. No spontaneous breaths or pulse for greater than 2 mins. Family and NP notified.  Family wishes to come to bedside.

## 2020-02-26 DEATH — deceased

## 2020-03-28 NOTE — Death Summary Note (Signed)
DEATH SUMMARY   Patient Details  Name: Lucas Amend Sr. MRN: [id number] DOB: 1940-12-21  Admission/Discharge Information   Admit Date:  2020/02/21  Date of Death: Date of Death: 03-02-20  Time of Death: Time of Death: 1310-06-25  Length of Stay: June 23, 2022  Referring Physician: Patient, No Pcp Per   Reason(s) for Hospitalization  Acute respiratory failure with hypoxia  Diagnoses  Preliminary cause of death:   ARDS due to COVID-19 pneumonia  Secondary Diagnoses (including complications and co-morbidities):  Principal Problem:   Acute respiratory distress syndrome (ARDS) due to COVID-19 virus (HCC) Active Problems:   COVID   Acute respiratory failure with hypoxia (HCC)   Sepsis (HCC)   Altered mental status   Atrial fibrillation with RVR (HCC)   NSTEMI (non-ST elevated myocardial infarction) (HCC)   Infection, Pseudomonas Pseudomonas associated pneumonia Septic shock  Acute toxic metabolic encephalopathy Seizures Subdural hematoma Acute on chronic renal failure Hyperglycemia History of CAD History hypertension  Brief Hospital Course (including significant findings, care, treatment, and services provided and events leading to death)  Lucas de La Chapman Moss Sr. is a 80 y.o. year old male admitted 02-21-23 with COVID-19 pneumonia, resultant ARDS and acute respiratory failure requiring mechanical ventilation.  Course complicated by atrial fibrillation with RVR and resultant combined cardiogenic and septic shock.  He was treated with mechanical ventilation, remdesivir, steroids.  Is also treated empirically with antibiotics for possible superimposed healthcare associated pneumonia.  Cultures ultimately grew out Pseudomonas that was pansensitive.  Onset seizure on 12/11, treated with Keppra.  A CT scan of the head showed a small left subdural hematoma.  Unfortunately despite maximal support and prolonged mechanical ventilation he did not show any improvement or any progress  toward tolerating spontaneous breathing.  Discussions were undertaken with the patient's family regarding direction of his care.  Decision was made to transition him to comfort on 2020-03-02.  Expired on that same day.  Pertinent Labs and Studies  Significant Diagnostic Studies EEG  Result Date: 02/05/2020 Rejeana Brock, MD     02/05/2020 12:07 PM History: 80 year old male diagnosed with Covid, being evaluated for seizure. Sedation: Midazolam, propofol Technique: This is a 21 channel routine scalp EEG performed at the bedside with bipolar and monopolar montages arranged in accordance to the international 10/20 system of electrode placement. One channel was dedicated to EKG recording. Background: The background consists of low voltage smoothly contoured irregular delta and theta range activities with occasional centrally predominant runs of beta resembling sleep spindles.  No epileptiform activity was seen Photic stimulation: Physiologic driving is now performed EEG Abnormalities: 1) generalized irregular slow activity 2) absent PDR Clinical Interpretation: This EEG is consistent with the patient's sedated state. There was no seizure or seizure predisposition recorded on this study. Please note that lack of epileptiform activity on EEG does not preclude the possibility of epilepsy. Ritta Slot, MD [facility] [phone] If 7pm- 7am, please page neurology on call as listed in AMION.   CT HEAD WO CONTRAST  Addendum Date: 02/05/2020   ADDENDUM REPORT: 02/05/2020 15:07 ADDENDUM: Study discussed by telephone with Mechele Collin MD on 02/05/2020 at 1441 hours. Electronically Signed   By: Odessa Fleming M.D.   On: 02/05/2020 15:07   Result Date: 02/05/2020 CLINICAL DATA:  80 year old male with unexplained altered mental status. Seizure. COVID-19. EXAM: CT HEAD WITHOUT CONTRAST TECHNIQUE: Contiguous axial images were obtained from the base of the skull through the vertex without  intravenous contrast. COMPARISON:  Head CT 02/03/2020.  FINDINGS: Brain: Stable cerebral volume.  No ventriculomegaly. Less motion artifact today. There does appear to be a small predominantly low-density left side subdural hematoma along the superior convexity (series 5, image 32). This is minimally increased in retrospect, 2-3 mil. No significant intracranial mass effect. No other intracranial hemorrhage is identified.  No midline shift. Gray-white matter differentiation is stable and largely normal for age. No acute or recent cortically based infarct identified. Vascular: Calcified atherosclerosis at the skull base. No suspicious intracranial vascular hyperdensity. Skull: No acute osseous abnormality identified. Sinuses/Orbits: Increased opacification of bilateral middle ears and mastoids. Moderate ethmoid sinus mucosal thickening not significantly changed. Other: No acute orbit or scalp soft tissue finding identified. Intubated on the scout view. IMPRESSION: 1. Small low-density Left Side Subdural Hematoma is more conspicuous today but not significantly changed from 02/03/2020. No associated intracranial mass effect. 2. No other acute intracranial abnormality identified. Otherwise stable non contrast CT appearance of the brain. 3. Intubated, with increased middle ear and mastoid opacification. Electronically Signed: By: Odessa Fleming M.D. On: 02/05/2020 14:39   CT Head Wo Contrast  Result Date: 02/03/2020 CLINICAL DATA:  Status post fall. EXAM: CT HEAD WITHOUT CONTRAST TECHNIQUE: Contiguous axial images were obtained from the base of the skull through the vertex without intravenous contrast. COMPARISON:  None. FINDINGS: Brain: There is mild to moderate severity cerebral atrophy with widening of the extra-axial spaces and ventricular dilatation. There are areas of decreased attenuation within the white matter tracts of the supratentorial brain, consistent with microvascular disease changes. Vascular: No hyperdense  vessel or unexpected calcification. Skull: Normal. Negative for fracture or focal lesion. Sinuses/Orbits: There is mild to moderate severity bilateral ethmoid sinus mucosal thickening. Other: None. IMPRESSION: 1. Generalized atrophy and microvascular disease changes of the supratentorial brain. 2. No acute intracranial abnormality. 3. Mild to moderate severity bilateral ethmoid sinus disease. Electronically Signed   By: Aram Candela M.D.   On: 02/03/2020 00:23   DG Chest Port 1 View  Result Date: 02/11/2020 CLINICAL DATA:  Hypoxia EXAM: PORTABLE CHEST 1 VIEW COMPARISON:  February 09, 2020 FINDINGS: Endotracheal tube tip is 4.8 cm above the carina. Nasogastric tube tip and side port are in the stomach. Central catheter tip is in the superior vena cava near the cavoatrial junction. Pacemaker leads are attached to the right heart. No pneumothorax. There is multifocal airspace opacity, marginally increased in the left upper lobe and essentially stable elsewhere. There is a suspected small left pleural effusion. There is stable cardiomegaly. Pulmonary vascularity is within normal limits. No adenopathy. There is aortic atherosclerosis. Status post median sternotomy. IMPRESSION: Multifocal airspace opacity which likely represents multifocal pneumonia, slightly increased in the left upper lobe and similar elsewhere. A degree of superimposed pulmonary edema cannot be excluded. Stable cardiac prominence. Tube and catheter positions as described without evident pneumothorax. Aortic Atherosclerosis (ICD10-I70.0). Electronically Signed   By: Bretta Bang III M.D.   On: 02/11/2020 07:58   DG Chest Port 1 View  Result Date: 02/09/2020 CLINICAL DATA:  ARDS due to COVID EXAM: PORTABLE CHEST 1 VIEW COMPARISON:  02/08/2020 FINDINGS: Left pacer, endotracheal tube and NG tube remain in place, unchanged. Worsening bilateral airspace disease, right greater than left. No effusions or pneumothorax. No acute bony  abnormality. IMPRESSION: Worsening bilateral airspace disease, right greater than left. Electronically Signed   By: Charlett Nose M.D.   On: 02/09/2020 03:19   DG Chest Port 1 View  Result Date: 02/08/2020 CLINICAL DATA:  Hypoxia.  Reported COVID-19 positive  EXAM: PORTABLE CHEST 1 VIEW COMPARISON:  February 03, 2020. FINDINGS: Endotracheal tube tip is 5.5 cm above the carina. Central catheter tip is in the superior vena cava. Nasogastric tube tip and side port in stomach. Pacemaker leads attached to right heart, stable. No pneumothorax. In comparison with most recent study, there has been considerable clearing of airspace opacity from mid and to a lesser extent lower lung regions. Patchy ill-defined opacity remains in the lung bases. No new opacity evident. Heart is mildly enlarged with pulmonary vascularity normal. No adenopathy. There is aortic atherosclerosis. No bone lesions. IMPRESSION: Tube and catheter positions as described without pneumothorax. Persistent ill-defined airspace opacity in the lung bases, likely due to atypical organism pneumonia. Considerable interval clearing of airspace opacity bilaterally compared to most recent study with no new opacity evident. Stable cardiac silhouette. Aortic Atherosclerosis (ICD10-I70.0). Electronically Signed   By: Lowella Grip III M.D.   On: 02/08/2020 13:11   DG Chest Portable 1 View  Result Date: 02/03/2020 CLINICAL DATA:  Post intubation right IJ placement. EXAM: PORTABLE CHEST 1 VIEW COMPARISON:  2020-02-06. FINDINGS: Interval intubation with ET tube approximately 4.8 cm above the carina. Improved aeration with redemonstrated bilateral extensive airspace opacities. No visible pleural effusions or pneumothorax. Right IJ central venous catheter with the tip projecting near the superior cavoatrial junction. Gastric tube courses below the diaphragm. Similar mildly enlarged cardiac silhouette. CABG with similar fractured superior two median  sternotomy wires. Left approach subclavian approach cardiac rhythm maintenance device. IMPRESSION: 1. Interval intubation with ET tube approximately 4.8 cm above the carina. Improved aeration with redemonstrated bilateral extensive airspace opacities concerning for pneumonia 2. Right IJ central venous catheter with the tip projecting near the superior cavoatrial junction. No visible Newman. Electronically Signed   By: Margaretha Sheffield MD   On: 02/03/2020 11:41   DG Chest Portable 1 View  Result Date: 06-Feb-2020 CLINICAL DATA:  Hypoxia EXAM: PORTABLE CHEST 1 VIEW COMPARISON:  None. FINDINGS: Post sternotomy changes. Left-sided pacing device with leads projecting over right atrium and slightly unusual course of additional lead possibly over coronary sinus region. Mild cardiomegaly with aortic atherosclerosis. Extensive bilateral airspace disease with consolidations and ground-glass opacities. No pleural effusion or pneumothorax. IMPRESSION: 1. Extensive bilateral airspace disease with consolidations and ground-glass opacities, favored to represent bilateral pneumonia. 2. Mild cardiomegaly. Electronically Signed   By: Donavan Foil M.D.   On: 02/06/2020 23:43   VAS [place] LOWER EXTREMITY VENOUS (DVT)  Result Date: 02/06/2020  Lower Venous DVT Study Indications: COVID-19 positive, d-dimer 2.36.  Limitations: Body habitus and poor ultrasound/tissue interface. Comparison Study: No prior study Performing Technologist: Maudry Mayhew MHA, RDMS, RVT, RDCS  Examination Guidelines: A complete evaluation includes B-mode imaging, spectral Doppler, color Doppler, and power Doppler as needed of all accessible portions of each vessel. Bilateral testing is considered an integral part of a complete examination. Limited examinations for reoccurring indications may be performed as noted. The reflux portion of the exam is performed with the patient in reverse Trendelenburg.   +---------+---------------+---------+-----------+----------+--------------+ RIGHT    CompressibilityPhasicitySpontaneityPropertiesThrombus Aging +---------+---------------+---------+-----------+----------+--------------+ CFV      Full           Yes      Yes                                 +---------+---------------+---------+-----------+----------+--------------+ SFJ      Full                                                        +---------+---------------+---------+-----------+----------+--------------+  FV Prox  Full                                                        +---------+---------------+---------+-----------+----------+--------------+ FV Mid   Full                                                        +---------+---------------+---------+-----------+----------+--------------+ FV DistalFull                                                        +---------+---------------+---------+-----------+----------+--------------+ PFV      Full                                                        +---------+---------------+---------+-----------+----------+--------------+ POP      Full           Yes      Yes                                 +---------+---------------+---------+-----------+----------+--------------+ PTV      Full                                                        +---------+---------------+---------+-----------+----------+--------------+ PERO     Full                                                        +---------+---------------+---------+-----------+----------+--------------+   +---------+---------------+---------+-----------+----------+--------------+ LEFT     CompressibilityPhasicitySpontaneityPropertiesThrombus Aging +---------+---------------+---------+-----------+----------+--------------+ CFV      Full           Yes      Yes                                  +---------+---------------+---------+-----------+----------+--------------+ SFJ      Full                                                        +---------+---------------+---------+-----------+----------+--------------+ FV Prox  Full                                                        +---------+---------------+---------+-----------+----------+--------------+  FV Mid   Full                                                        +---------+---------------+---------+-----------+----------+--------------+ FV DistalFull                                                        +---------+---------------+---------+-----------+----------+--------------+ PFV      Full                                                        +---------+---------------+---------+-----------+----------+--------------+ POP      Full           Yes      Yes                                 +---------+---------------+---------+-----------+----------+--------------+ PTV      Full                                                        +---------+---------------+---------+-----------+----------+--------------+ PERO     Full                                                        +---------+---------------+---------+-----------+----------+--------------+   Left Technical Findings: Not visualized segments include limited evaluation left peroneal veins.   Summary: RIGHT: - There is no evidence of deep vein thrombosis in the lower extremity.  - No cystic structure found in the popliteal fossa.  LEFT: - There is no evidence of deep vein thrombosis in the lower extremity. However, portions of this examination were limited- see technologist comments above.  - No cystic structure found in the popliteal fossa.  *See table(s) above for measurements and observations. Electronically signed by Heath Lark on 02/06/2020 at 1:13:49 PM.    Final    ECHOCARDIOGRAM LIMITED  Result Date: 02/03/2020     ECHOCARDIOGRAM LIMITED REPORT   Patient Name:   Lucas de La Chapman Moss Sr. Date of Exam: 02/03/2020 Medical Rec #:  [id number]                           Height:       62.0 in Accession #:    [id number]                          Weight:       200.0 lb Date of Birth:  1941/02/06  BSA:          1.912 m Patient Age:    79 years                            BP:           108/68 mmHg Patient Gender: M                                   HR:           72 bpm. Exam Location:  Inpatient Procedure: 2D Echo STAT ECHO Indications:    NSTEMI I21.4  History:        Patient has no prior history of Echocardiogram examinations.                 Ischemic Cardiomyopathy, Pacemaker; Risk Factors:Hypertension.  Sonographer:    Thurman Coyer RDCS (AE) Referring Phys: [id number] Lorin Glass  Sonographer Comments: Echo performed with patient supine and on artificial respirator. IMPRESSIONS  1. Poor acoustic windows Endocardium is difficult to see Overall LVEF is porbably low normal with hypokinesis of the base /mid inferior/inferolateral walls. Would recomm limited echo with Definiity to further define wall motion.. Left ventricular diastolic parameters are indeterminate.  2. Right ventricular systolic function is normal. The right ventricular size is normal. There is moderately elevated pulmonary artery systolic pressure.  3. The aortic valve is grossly normal. Aortic valve regurgitation is not visualized.  4. The inferior vena cava is dilated in size with <50% respiratory variability, suggesting right atrial pressure of 15 mmHg. FINDINGS  Left Ventricle: Poor acoustic windows Endocardium is difficult to see Overall LVEF is porbably low normal with hypokinesis of the base /mid inferior/inferolateral walls. Would recomm limited echo with Definiity to further define wall motion. Left ventricular diastolic parameters are indeterminate. Right Ventricle: The right ventricular size is normal. Right ventricular  systolic function is normal. There is moderately elevated pulmonary artery systolic pressure. The tricuspid regurgitant velocity is 2.74 m/s, and with an assumed right atrial pressure of 15 mmHg, the estimated right ventricular systolic pressure is 45.0 mmHg. Left Atrium: Left atrial size was normal in size. Right Atrium: Right atrial size was normal in size. Pericardium: There is no evidence of pericardial effusion. Tricuspid Valve: The tricuspid valve is not well visualized. Tricuspid valve regurgitation is mild. Aortic Valve: The aortic valve is grossly normal. Aortic valve regurgitation is not visualized. Pulmonic Valve: The pulmonic valve was normal in structure. Pulmonic valve regurgitation is not visualized. Venous: The inferior vena cava is dilated in size with less than 50% respiratory variability, suggesting right atrial pressure of 15 mmHg. LEFT ATRIUM           Index LA Vol (A4C): 23.4 ml 12.24 ml/m  TRICUSPID VALVE TR Peak grad:   30.0 mmHg TR Vmax:        274.00 cm/s Dietrich Pates MD Electronically signed by Dietrich Pates MD Signature Date/Time: 02/03/2020/12:16:33 PM    Final      Les Pou Akshay Spang 02/29/2020, 12:54 AM

## 2022-06-22 IMAGING — CT CT HEAD W/O CM
3 series · 14 of 47 positions shown, 16 images · non-contrast
Comparison: Head CT 02/03/2020.
COMPARISON: Head CT 02/03/2020.

Addendum:
CLINICAL DATA: 79-year-old male with unexplained altered mental
status. Seizure. [id number].

EXAM:
CT HEAD WITHOUT CONTRAST
TECHNIQUE: Contiguous axial images were obtained from the base of the skull
through the vertex without intravenous contrast.

[Series 3: head 5.0 h30s · axial · 0.46mm/px · z∈[-101,+44]mm · 8 of 35 slices shown, 10 images]
[im 3/35  brain]
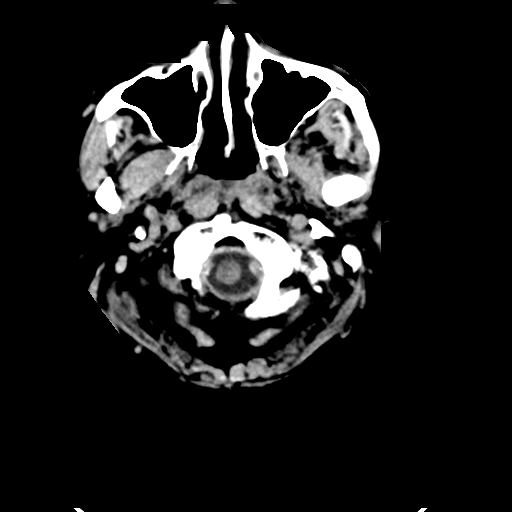
[im 3/35  bone]
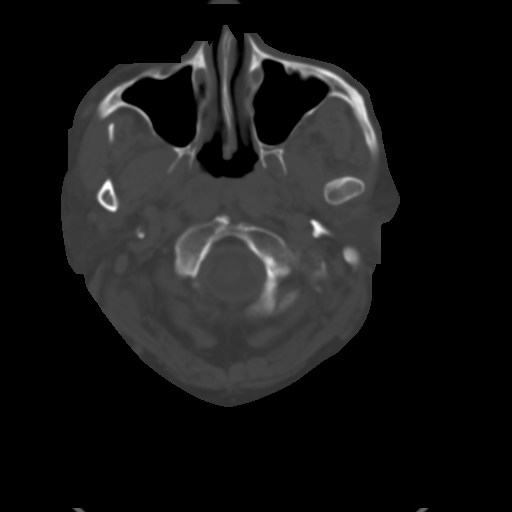
[im 8/35  brain]
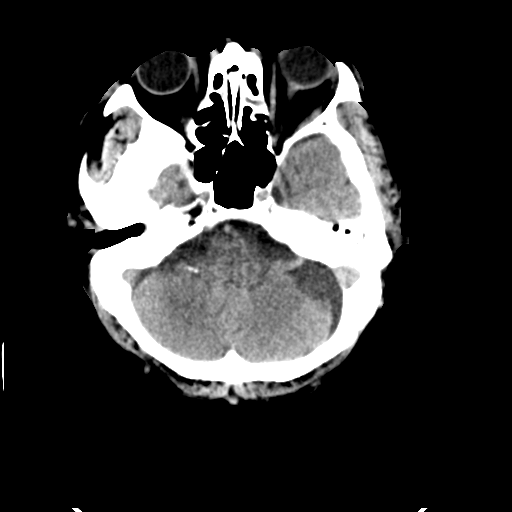
[im 11/35  brain]
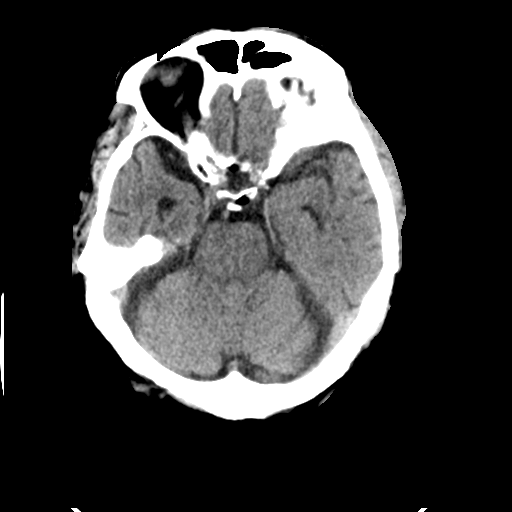
[im 16/35  brain]
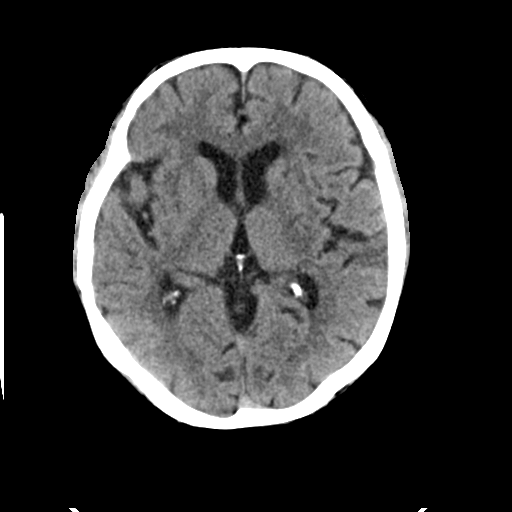
[im 19/35  brain]
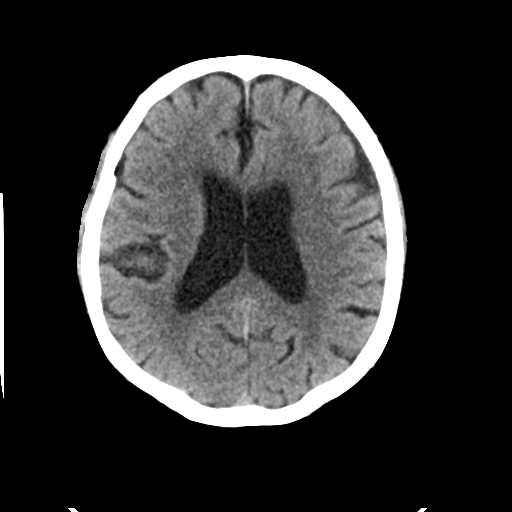
[im 19/35  bone]
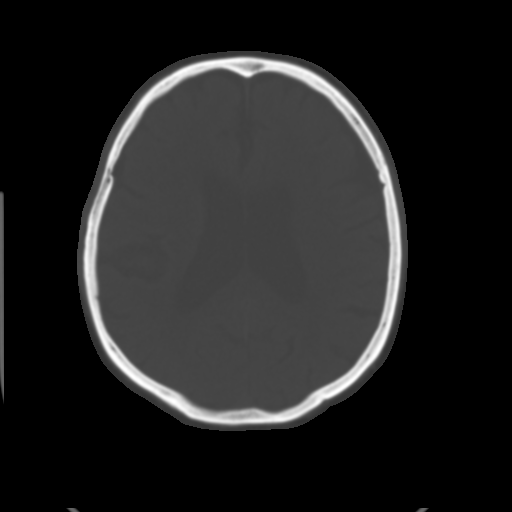
[im 24/35  brain]
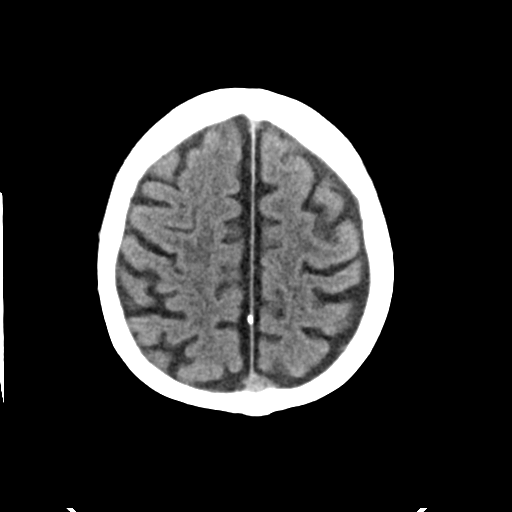
[im 27/35  brain]
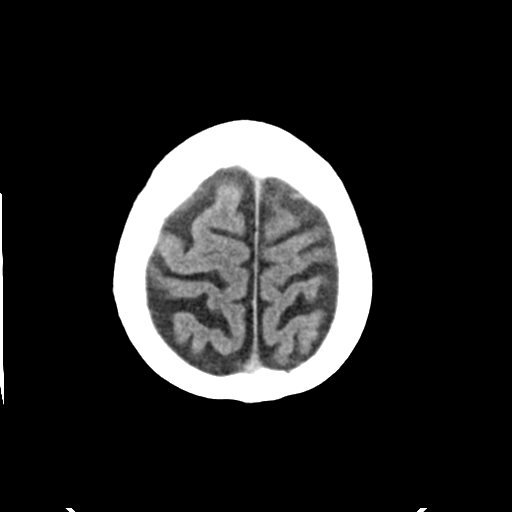
[im 32/35  brain]
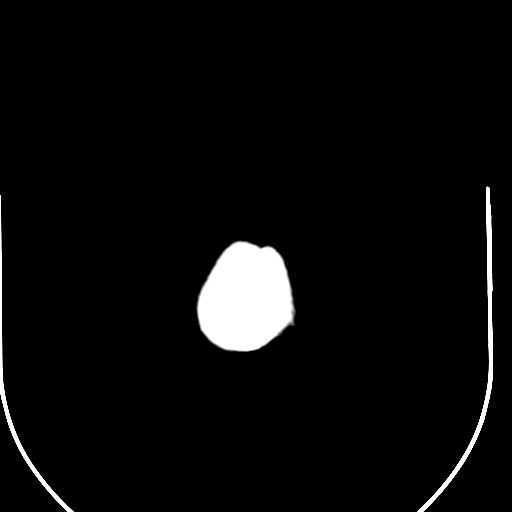

[Series 5: head 3.0 mpr cor · coronal · 0.34mm/px · 3 of 69 slices shown]
[im 23/69  brain]
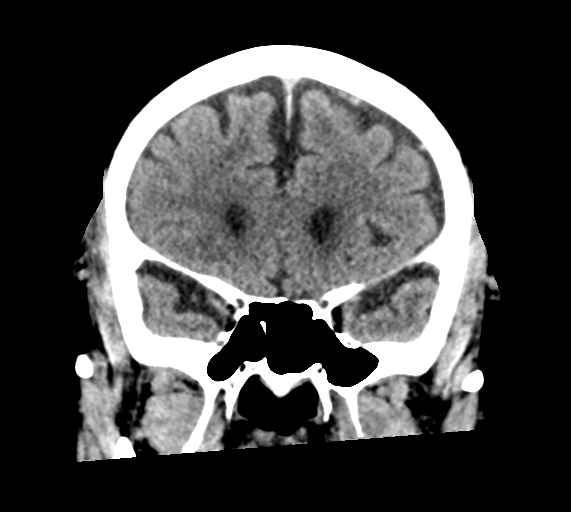
[im 31/69  brain]
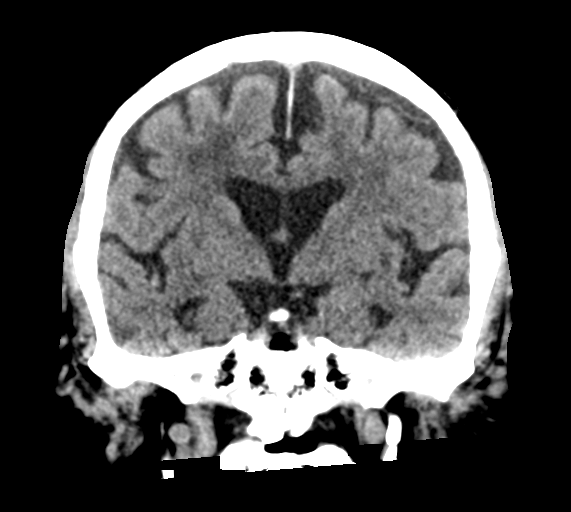
[im 38/69  brain]
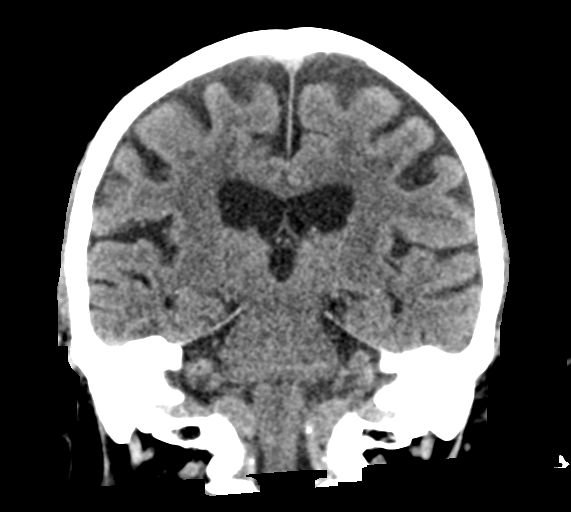

[Series 6: head 3.0 mpr sag · sagittal · 0.34mm/px · 3 of 67 slices shown]
[im 23/67  brain]
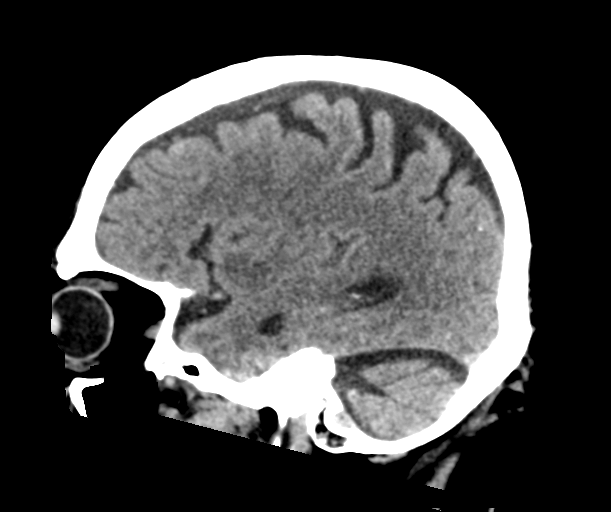
[im 34/67  brain]
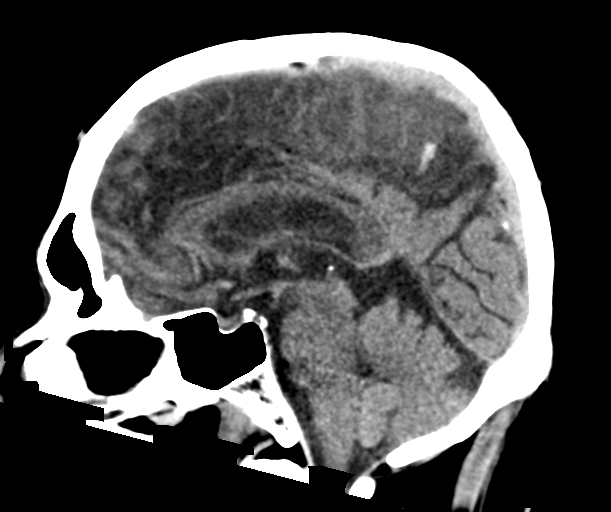
[im 45/67  brain]
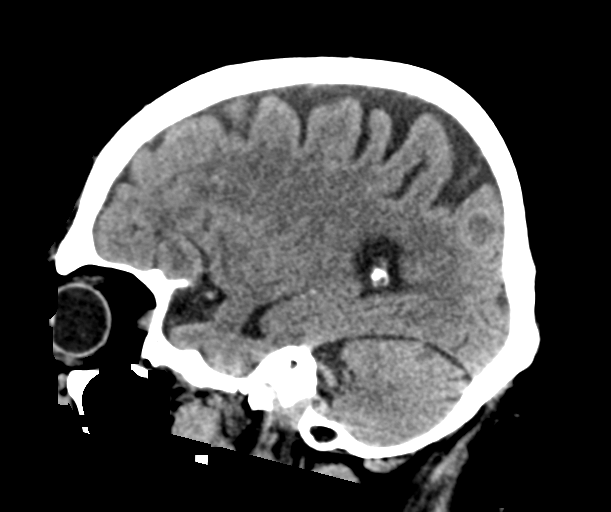

[14 of 47 positions shown; findings below may reference images not displayed]

FINDINGS: Brain: Stable cerebral volume.  No ventriculomegaly.

Less motion artifact today. There does appear to be a small
predominantly low-density left side subdural hematoma along the
superior convexity (series 5, image 32). This is minimally increased
in retrospect, 2-3 Holguin. No significant intracranial mass effect.

No other intracranial hemorrhage is identified.  No midline shift.

Gray-white matter differentiation is stable and largely normal for
age. No acute or recent cortically based infarct identified.

Vascular: Calcified atherosclerosis at the skull base. No suspicious
intracranial vascular hyperdensity.

Skull: No acute osseous abnormality identified.

Sinuses/Orbits: Increased opacification of bilateral middle ears and
mastoids. Moderate ethmoid sinus mucosal thickening not
significantly changed.

Other: No acute orbit or scalp soft tissue finding identified.
Intubated on the scout view.
IMPRESSION: 1. Small low-density Left Side Subdural Hematoma is more conspicuous
today but not significantly changed from 02/03/2020. No associated
intracranial mass effect.

2. No other acute intracranial abnormality identified. Otherwise
stable non contrast CT appearance of the brain.

3. Intubated, with increased middle ear and mastoid opacification.

ADDENDUM:
Study discussed by telephone with NOHADE MELL on 02/05/2020 at
4114 hours.

*** End of Addendum ***
FINDINGS: Brain: Stable cerebral volume.  No ventriculomegaly.

Less motion artifact today. There does appear to be a small
predominantly low-density left side subdural hematoma along the
superior convexity (series 5, image 32). This is minimally increased
in retrospect, 2-3 Holguin. No significant intracranial mass effect.

No other intracranial hemorrhage is identified.  No midline shift.

Gray-white matter differentiation is stable and largely normal for
age. No acute or recent cortically based infarct identified.

Vascular: Calcified atherosclerosis at the skull base. No suspicious
intracranial vascular hyperdensity.

Skull: No acute osseous abnormality identified.

Sinuses/Orbits: Increased opacification of bilateral middle ears and
mastoids. Moderate ethmoid sinus mucosal thickening not
significantly changed.

Other: No acute orbit or scalp soft tissue finding identified.
Intubated on the scout view.
IMPRESSION: 1. Small low-density Left Side Subdural Hematoma is more conspicuous
today but not significantly changed from 02/03/2020. No associated
intracranial mass effect.

2. No other acute intracranial abnormality identified. Otherwise
stable non contrast CT appearance of the brain.

3. Intubated, with increased middle ear and mastoid opacification.

## 2022-06-28 IMAGING — DX DG CHEST 1V PORT
1 series · 1 of 1 positions shown · non-contrast
Comparison: February 09, 2020

CLINICAL DATA: Hypoxia

EXAM:
PORTABLE CHEST 1 VIEW

[chest ap]
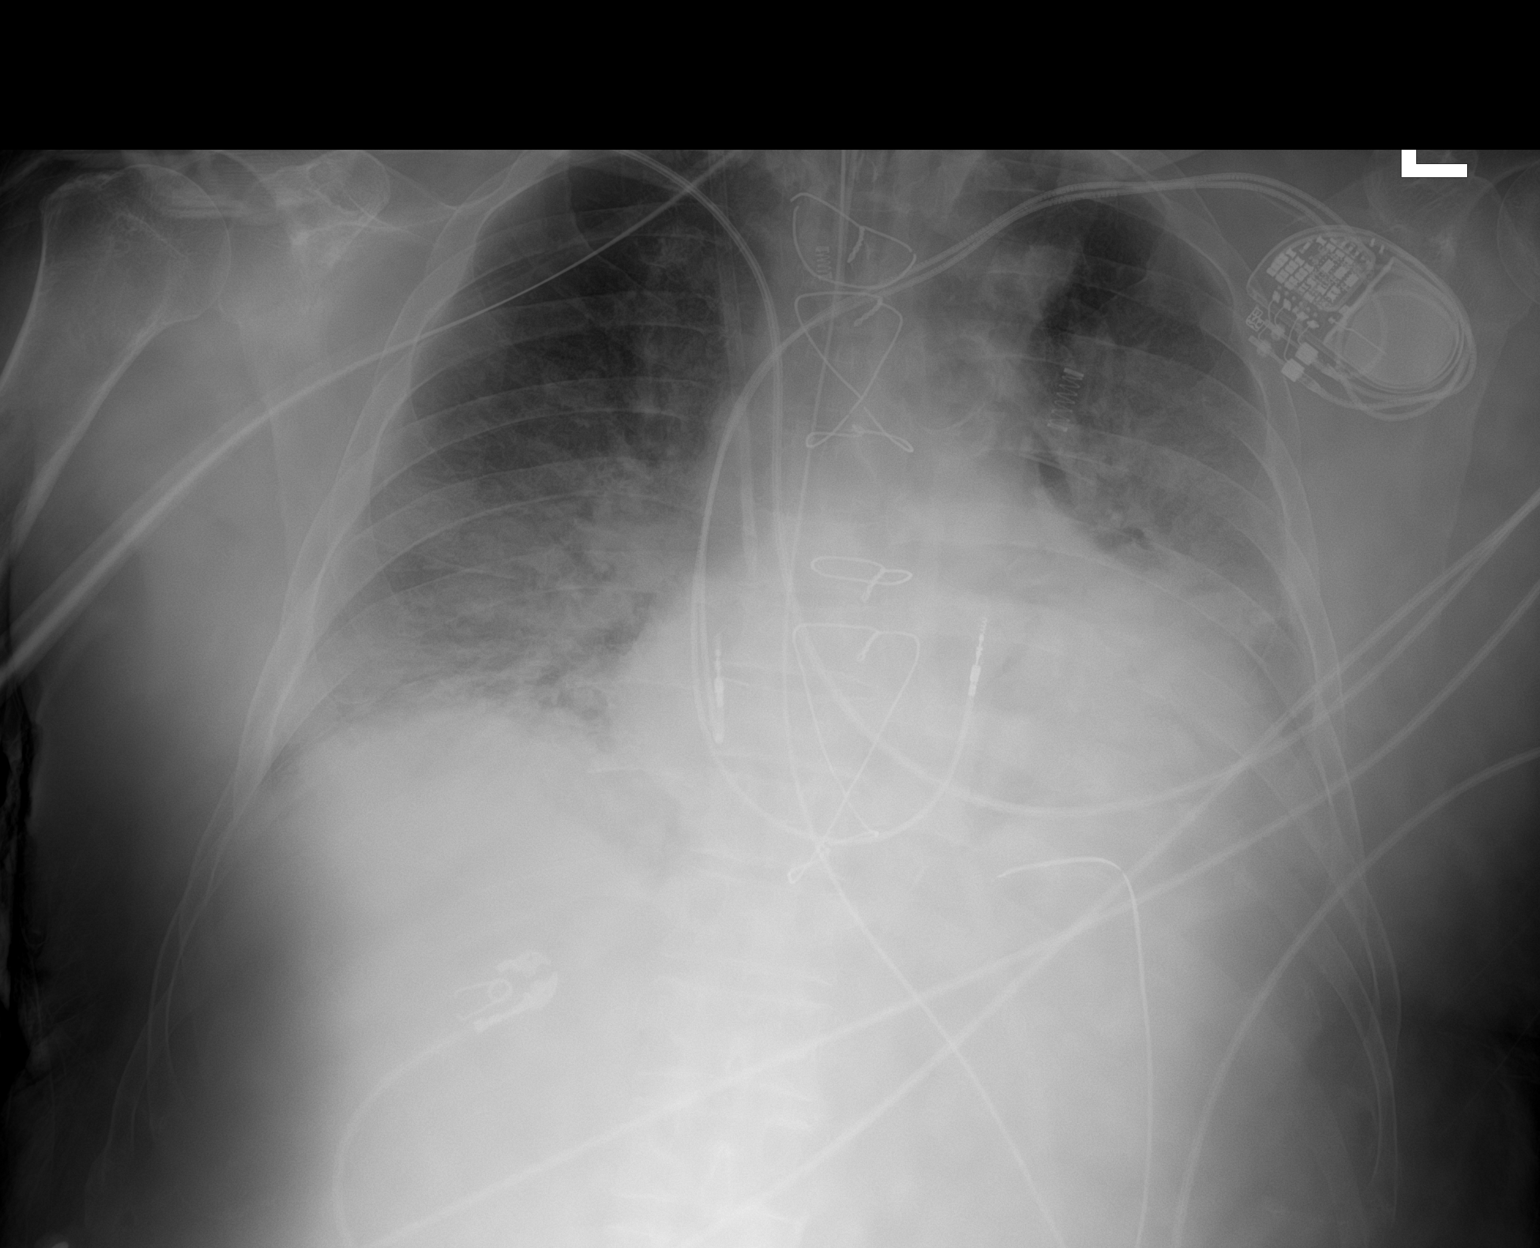

[1 of 1 positions shown; findings below may reference images not displayed]

FINDINGS: Endotracheal tube tip is 4.8 cm above the carina. Nasogastric tube
tip and side port are in the stomach. Central catheter tip is in the
superior vena cava near the cavoatrial junction. Pacemaker leads are
attached to the right heart. No pneumothorax. There is multifocal
airspace opacity, marginally increased in the left upper lobe and
essentially stable elsewhere. There is a suspected small left
pleural effusion. There is stable cardiomegaly. Pulmonary
vascularity is within normal limits. No adenopathy. There is aortic
atherosclerosis. Status post median sternotomy.
IMPRESSION: Multifocal airspace opacity which likely represents multifocal
pneumonia, slightly increased in the left upper lobe and similar
elsewhere. A degree of superimposed pulmonary edema cannot be
excluded. Stable cardiac prominence. Tube and catheter positions as
described without evident pneumothorax.

Aortic Atherosclerosis ([id number]).
